# Patient Record
Sex: Female | Born: 1935 | Hispanic: No | Marital: Married | Smoking: Never smoker
Health system: Southern US, Community
[De-identification: ages and names within clinical notes are randomized; demographics above are authoritative.]

## PROBLEM LIST (undated history)

## (undated) DIAGNOSIS — J302 Other seasonal allergic rhinitis: Secondary | ICD-10-CM

## (undated) DIAGNOSIS — H35039 Hypertensive retinopathy, unspecified eye: Secondary | ICD-10-CM

## (undated) DIAGNOSIS — M170 Bilateral primary osteoarthritis of knee: Secondary | ICD-10-CM

## (undated) DIAGNOSIS — H409 Unspecified glaucoma: Secondary | ICD-10-CM

## (undated) DIAGNOSIS — I10 Essential (primary) hypertension: Secondary | ICD-10-CM

## (undated) DIAGNOSIS — H3530 Unspecified macular degeneration: Secondary | ICD-10-CM

## (undated) HISTORY — PX: CATARACT EXTRACTION: SUR2

## (undated) HISTORY — PX: WISDOM TOOTH EXTRACTION: SHX21

## (undated) HISTORY — DX: Hypertensive retinopathy, unspecified eye: H35.039

## (undated) HISTORY — PX: EYE SURGERY: SHX253

## (undated) HISTORY — PX: TONSILLECTOMY: SUR1361

## (undated) HISTORY — DX: Unspecified macular degeneration: H35.30

## (undated) HISTORY — DX: Unspecified glaucoma: H40.9

## (undated) HISTORY — PX: COLONOSCOPY: SHX174

---

## 1998-06-18 ENCOUNTER — Ambulatory Visit (HOSPITAL_COMMUNITY): Admission: RE | Admit: 1998-06-18 | Discharge: 1998-06-18 | Payer: Self-pay | Admitting: Internal Medicine

## 1998-06-18 ENCOUNTER — Encounter: Payer: Self-pay | Admitting: Internal Medicine

## 2003-09-10 ENCOUNTER — Encounter: Admission: RE | Admit: 2003-09-10 | Discharge: 2003-09-10 | Payer: Self-pay

## 2004-05-09 ENCOUNTER — Ambulatory Visit (HOSPITAL_COMMUNITY): Admission: RE | Admit: 2004-05-09 | Discharge: 2004-05-09 | Payer: Self-pay | Admitting: Gastroenterology

## 2005-04-27 ENCOUNTER — Encounter: Admission: RE | Admit: 2005-04-27 | Discharge: 2005-04-27 | Payer: Self-pay | Admitting: Gastroenterology

## 2007-03-06 ENCOUNTER — Encounter: Admission: RE | Admit: 2007-03-06 | Discharge: 2007-03-06 | Payer: Self-pay | Admitting: Internal Medicine

## 2008-03-06 ENCOUNTER — Encounter: Admission: RE | Admit: 2008-03-06 | Discharge: 2008-03-06 | Payer: Self-pay | Admitting: Internal Medicine

## 2008-03-18 ENCOUNTER — Encounter: Admission: RE | Admit: 2008-03-18 | Discharge: 2008-03-18 | Payer: Self-pay | Admitting: Internal Medicine

## 2009-09-15 ENCOUNTER — Encounter: Admission: RE | Admit: 2009-09-15 | Discharge: 2009-09-15 | Payer: Self-pay | Admitting: Internal Medicine

## 2010-04-10 ENCOUNTER — Encounter: Payer: Self-pay | Admitting: Internal Medicine

## 2010-08-05 NOTE — Op Note (Signed)
NAME:  Kaitlyn Berry, Kaitlyn Berry                  ACCOUNT NO.:  [id number]   MEDICAL RECORD NO.:  [id number]          PATIENT TYPE:  AMB   LOCATION:  ENDO                         FACILITY:  [facility]   PHYSICIAN:  John C. Madilyn Fireman, M.D.    DATE OF BIRTH:  June 09, 1935   DATE OF PROCEDURE:  05/09/2004  DATE OF DISCHARGE:                                 OPERATIVE REPORT   PROCEDURE:  Colonoscopy.   INDICATION FOR PROCEDURE:  Family history of colon cancer in a first-degree  relative.   PROCEDURE:  The patient was placed in the left lateral decubitus position  and placed on the pulse monitor with continuous low flow oxygen delivered  via nasal cannula.  He was sedated with 50 mcg IV fentanyl and 4 mg IV  Versed.  The Olympus video colonoscope was inserted into the rectum and  advanced its entire length but despite multiple position changes, torquing  maneuvers, and abdominal pressure, I was unable to reach the cecum despite  having the scope inserted its entire length.  It was not certain but felt  that the point of furthest penetration was to approximately the hepatic  flexure.  This area appeared normal, as did the transverse, descending,  sigmoid, and rectum.  The scope was then withdrawn and the patient returned  to the recovery room in stable condition.  He tolerated the procedure well  and there were no immediate complications.   IMPRESSION:  Normal incomplete colonoscopy to approximately the hepatic  flexure.   PLAN:  Will follow up with barium enema.      JCH/MEDQ  D:  05/09/2004  T:  05/09/2004  Job:  [id number]   cc:   Amalia Greenhouse. Frankey Poot, M.D.

## 2012-08-22 ENCOUNTER — Other Ambulatory Visit: Payer: Self-pay | Admitting: Internal Medicine

## 2012-08-22 DIAGNOSIS — M810 Age-related osteoporosis without current pathological fracture: Secondary | ICD-10-CM

## 2014-03-30 DIAGNOSIS — M179 Osteoarthritis of knee, unspecified: Secondary | ICD-10-CM | POA: Diagnosis not present

## 2014-03-30 DIAGNOSIS — F419 Anxiety disorder, unspecified: Secondary | ICD-10-CM | POA: Diagnosis not present

## 2014-03-30 DIAGNOSIS — R7301 Impaired fasting glucose: Secondary | ICD-10-CM | POA: Diagnosis not present

## 2014-03-30 DIAGNOSIS — I10 Essential (primary) hypertension: Secondary | ICD-10-CM | POA: Diagnosis not present

## 2014-04-29 DIAGNOSIS — H401231 Low-tension glaucoma, bilateral, mild stage: Secondary | ICD-10-CM | POA: Diagnosis not present

## 2014-05-20 DIAGNOSIS — H401231 Low-tension glaucoma, bilateral, mild stage: Secondary | ICD-10-CM | POA: Diagnosis not present

## 2014-06-08 DIAGNOSIS — I10 Essential (primary) hypertension: Secondary | ICD-10-CM | POA: Diagnosis not present

## 2014-06-08 DIAGNOSIS — J302 Other seasonal allergic rhinitis: Secondary | ICD-10-CM | POA: Diagnosis not present

## 2014-06-08 DIAGNOSIS — R7301 Impaired fasting glucose: Secondary | ICD-10-CM | POA: Diagnosis not present

## 2014-06-08 DIAGNOSIS — F419 Anxiety disorder, unspecified: Secondary | ICD-10-CM | POA: Diagnosis not present

## 2014-06-29 DIAGNOSIS — R252 Cramp and spasm: Secondary | ICD-10-CM | POA: Diagnosis not present

## 2014-06-29 DIAGNOSIS — H6122 Impacted cerumen, left ear: Secondary | ICD-10-CM | POA: Diagnosis not present

## 2014-06-29 DIAGNOSIS — I10 Essential (primary) hypertension: Secondary | ICD-10-CM | POA: Diagnosis not present

## 2014-06-29 DIAGNOSIS — R7301 Impaired fasting glucose: Secondary | ICD-10-CM | POA: Diagnosis not present

## 2014-07-15 DIAGNOSIS — H401211 Low-tension glaucoma, right eye, mild stage: Secondary | ICD-10-CM | POA: Diagnosis not present

## 2014-09-18 DIAGNOSIS — R7301 Impaired fasting glucose: Secondary | ICD-10-CM | POA: Diagnosis not present

## 2014-09-18 DIAGNOSIS — Z13220 Encounter for screening for lipoid disorders: Secondary | ICD-10-CM | POA: Diagnosis not present

## 2014-09-18 DIAGNOSIS — I10 Essential (primary) hypertension: Secondary | ICD-10-CM | POA: Diagnosis not present

## 2014-09-18 DIAGNOSIS — K029 Dental caries, unspecified: Secondary | ICD-10-CM | POA: Diagnosis not present

## 2014-11-16 DIAGNOSIS — R7301 Impaired fasting glucose: Secondary | ICD-10-CM | POA: Diagnosis not present

## 2014-11-16 DIAGNOSIS — I10 Essential (primary) hypertension: Secondary | ICD-10-CM | POA: Diagnosis not present

## 2014-11-16 DIAGNOSIS — M810 Age-related osteoporosis without current pathological fracture: Secondary | ICD-10-CM | POA: Diagnosis not present

## 2014-11-16 DIAGNOSIS — N3281 Overactive bladder: Secondary | ICD-10-CM | POA: Diagnosis not present

## 2014-11-16 DIAGNOSIS — F419 Anxiety disorder, unspecified: Secondary | ICD-10-CM | POA: Diagnosis not present

## 2014-11-16 DIAGNOSIS — Z1389 Encounter for screening for other disorder: Secondary | ICD-10-CM | POA: Diagnosis not present

## 2014-11-26 ENCOUNTER — Other Ambulatory Visit: Payer: Self-pay | Admitting: Internal Medicine

## 2014-11-26 DIAGNOSIS — E2839 Other primary ovarian failure: Secondary | ICD-10-CM

## 2015-01-18 DIAGNOSIS — I10 Essential (primary) hypertension: Secondary | ICD-10-CM | POA: Diagnosis not present

## 2015-01-18 DIAGNOSIS — F419 Anxiety disorder, unspecified: Secondary | ICD-10-CM | POA: Diagnosis not present

## 2015-01-18 DIAGNOSIS — Z23 Encounter for immunization: Secondary | ICD-10-CM | POA: Diagnosis not present

## 2015-01-18 DIAGNOSIS — R7301 Impaired fasting glucose: Secondary | ICD-10-CM | POA: Diagnosis not present

## 2015-01-18 DIAGNOSIS — M179 Osteoarthritis of knee, unspecified: Secondary | ICD-10-CM | POA: Diagnosis not present

## 2016-05-25 ENCOUNTER — Ambulatory Visit (INDEPENDENT_AMBULATORY_CARE_PROVIDER_SITE_OTHER): Payer: 59 | Admitting: Podiatry

## 2016-05-25 ENCOUNTER — Encounter: Payer: Self-pay | Admitting: Podiatry

## 2016-05-25 VITALS — BP 193/87 | HR 58

## 2016-05-25 DIAGNOSIS — M79676 Pain in unspecified toe(s): Secondary | ICD-10-CM | POA: Diagnosis not present

## 2016-05-25 DIAGNOSIS — B351 Tinea unguium: Secondary | ICD-10-CM | POA: Diagnosis not present

## 2016-05-25 NOTE — Progress Notes (Signed)
   Subjective:    Patient ID: Kaitlyn Berry, female    DOB: 05/10/1935, 81 y.o.   MRN: [id number]  HPI this patient presents the office with chief complaint of painful long thick nails, especially the big toes on both feet. He says that the toenails become painful and sore walking and wearing her shoes. She states that she is now unable to self treat and see office today for definitive treatment of this painful nail condition    Review of Systems  All other systems reviewed and are negative.      Objective:   Physical Exam GENERAL APPEARANCE: Alert, conversant. Appropriately groomed. No acute distress.  VASCULAR: Pedal pulses are  palpable at  [facility] and PT bilateral.  Capillary refill time is immediate to all digits,  Normal temperature gradient.  Digital hair growth is present bilateral  NEUROLOGIC: sensation is normal to 5.07 monofilament at 5/5 sites bilateral.  Light touch is intact bilateral, Muscle strength normal.  MUSCULOSKELETAL: acceptable muscle strength, tone and stability bilateral.  Intrinsic muscluature intact bilateral.  Rectus appearance of foot and digits noted bilateral.   DERMATOLOGIC: skin color, texture, and turgor are within normal limits.  No preulcerative lesions or ulcers  are seen, no interdigital maceration noted.  No open lesions present.   No drainage noted.  NAILS  Thick disfigured discolored nails both feet.        Assessment & Plan:  Onychomycosis  B/L  IE  Debride nails.  RTC 3 months.   Helane GuntherGregory Mayer DPM

## 2016-08-17 ENCOUNTER — Ambulatory Visit: Payer: 59 | Admitting: Podiatry

## 2017-09-26 ENCOUNTER — Ambulatory Visit (INDEPENDENT_AMBULATORY_CARE_PROVIDER_SITE_OTHER): Payer: Medicare HMO | Admitting: Orthopaedic Surgery

## 2017-09-26 ENCOUNTER — Ambulatory Visit (INDEPENDENT_AMBULATORY_CARE_PROVIDER_SITE_OTHER): Payer: Medicare HMO

## 2017-09-26 ENCOUNTER — Encounter (INDEPENDENT_AMBULATORY_CARE_PROVIDER_SITE_OTHER): Payer: Self-pay | Admitting: Orthopaedic Surgery

## 2017-09-26 ENCOUNTER — Ambulatory Visit (INDEPENDENT_AMBULATORY_CARE_PROVIDER_SITE_OTHER): Payer: Self-pay

## 2017-09-26 DIAGNOSIS — M25562 Pain in left knee: Secondary | ICD-10-CM

## 2017-09-26 DIAGNOSIS — M25561 Pain in right knee: Secondary | ICD-10-CM

## 2017-09-26 DIAGNOSIS — M1711 Unilateral primary osteoarthritis, right knee: Secondary | ICD-10-CM

## 2017-09-26 DIAGNOSIS — G8929 Other chronic pain: Secondary | ICD-10-CM

## 2017-09-26 DIAGNOSIS — M545 Low back pain: Secondary | ICD-10-CM

## 2017-09-26 DIAGNOSIS — M1712 Unilateral primary osteoarthritis, left knee: Secondary | ICD-10-CM | POA: Diagnosis not present

## 2017-09-26 MED ORDER — LIDOCAINE HCL 1 % IJ SOLN
3.0000 mL | INTRAMUSCULAR | Status: AC | PRN
  Administered 2017-09-26: 3 mL

## 2017-09-26 MED ORDER — METHYLPREDNISOLONE ACETATE 40 MG/ML IJ SUSP
40.0000 mg | INTRAMUSCULAR | Status: AC | PRN
  Administered 2017-09-26: 40 mg via INTRA_ARTICULAR

## 2017-09-26 NOTE — Progress Notes (Signed)
Office Visit Note   Patient: Kaitlyn Berry           Date of Birth: 03-Aug-1935           MRN: [id number] Visit Date: 09/26/2017              Requested by: Fleet Contras, MD [address] [place], [place] [address] PCP: Fleet Contras, MD   Assessment & Plan: Visit Diagnoses:  1. Chronic pain of both knees   2. Chronic bilateral low back pain, with sciatica presence unspecified   3. Unilateral primary osteoarthritis, right knee   4. Unilateral primary osteoarthritis, left knee     Plan: Given the extent of her low back pain and right-sided sciatica I would like to obtain an MRI to see if she would benefit from an intervention such as epidural steroid injection to the right side.  As far as her knees go, we tried steroid injection to both knees today and I do feel she is a candidate for hyaluronic acid which we will order for both knees.  All questions concerns were answered and addressed.  She understands why were trying this treatment regimen and understands the risk minutes of steroid injections as well.  All questions concerns were answered and addressed.  We will see her back in 4 weeks to place hyaluronic acid injections in both her knees and go over the MRI of her lumbar spine.  Follow-Up Instructions: Return in about 1 month (around 10/24/2017).   Orders:  Orders Placed This Encounter  Procedures  . Large Joint Inj  . Large Joint Inj  . XR Knee 1-2 Views Left  . XR Knee 1-2 Views Right  . XR Lumbar Spine 2-3 Views   No orders of the defined types were placed in this encounter.     Procedures: Large Joint Inj: R knee on 09/26/2017 3:14 PM Indications: diagnostic evaluation and pain Details: 22 G 1.5 in needle, superolateral approach  Arthrogram: No  Medications: 3 mL lidocaine 1 %; 40 mg methylPREDNISolone acetate 40 MG/ML Outcome: tolerated well, no immediate complications Procedure, treatment alternatives, risks and benefits explained, specific risks discussed.  Consent was given by the patient. Immediately prior to procedure a time out was called to verify the correct patient, procedure, equipment, support staff and site/side marked as required. Patient was prepped and draped in the usual sterile fashion.   Large Joint Inj: L knee on 09/26/2017 3:14 PM Indications: diagnostic evaluation and pain Details: 22 G 1.5 in needle, superolateral approach  Arthrogram: No  Medications: 3 mL lidocaine 1 %; 40 mg methylPREDNISolone acetate 40 MG/ML Outcome: tolerated well, no immediate complications Procedure, treatment alternatives, risks and benefits explained, specific risks discussed. Consent was given by the patient. Immediately prior to procedure a time out was called to verify the correct patient, procedure, equipment, support staff and site/side marked as required. Patient was prepped and draped in the usual sterile fashion.       Clinical Data: No additional findings.   Subjective: Chief Complaint  Patient presents with  . Left Knee - Pain  . Right Knee - Pain  The patient is someone I am seeing for the first time with bilateral knee pain and low back pain.  I seen family members before.  Her pain is been daily and is been hurting for several months now with a low back.  It is waking her up at night as well.  Both knees hurt with the right worse than left.  She hurts with activities when she is up and when she is laying down.  Is been slowly getting worse with time.  She does describe sciatic symptoms going down the right leg.  She denies any change in bowel bladder function.  She denies any acute injuries.  She is not a diabetic.  HPI  Review of Systems She currently denies any headache, chest pain, shortness of breath, fever, chills, nausea, vomiting.  She is alert and oriented x3 in no acute distress  Objective: Vital Signs: There were no vitals taken for this visit.  Physical Exam She is alert and oriented x3 in no acute distress Ortho  Exam Examination of both knees shows varus malalignment of the right knee but neutral alignment of the left knee.  Knees have medial joint line tenderness and patellofemoral crepitation.  Both knees have full range of motion and are ligamentously stable.  She does have limitations with flexion extension of her lumbar spine with pain in the lower aspects of the lumbar spine at the midline and medial and lateral with flexion extension.  She has tight hamstrings.  She does have positive straight leg raise on the right side.  There is no weakness or numbness tingling her feet Specialty Comments:  No specialty comments available.  Imaging: Xr Knee 1-2 Views Left  Result Date: 09/26/2017 2 views of left knee show moderate arthritic findings but no acute findings.  Xr Knee 1-2 Views Right  Result Date: 09/26/2017 2 views of the right knee show severe tricompartmental fitting changes.  There is complete loss the medial joint space with sclerotic changes on that side.  There is varus malalignment.  There is significant patellofemoral disease as well.  Xr Lumbar Spine 2-3 Views  Result Date: 09/26/2017 2 views of the lumbar spine shows severe arthritic changes at L4-L5 and L5-S1 with significant disc space narrowing and loss of lordosis.  There is arthritic changes in the posterior elements as well.    PMFS History: There are no active problems to display for this patient.  History reviewed. No pertinent past medical history.  History reviewed. No pertinent family history.  History reviewed. No pertinent surgical history. Social History   Occupational History  . Not on file  Tobacco Use  . Smoking status: Never Smoker  . Smokeless tobacco: Never Used  Substance and Sexual Activity  . Alcohol use: No  . Drug use: No  . Sexual activity: Not on file

## 2017-09-28 ENCOUNTER — Telehealth (INDEPENDENT_AMBULATORY_CARE_PROVIDER_SITE_OTHER): Payer: Self-pay

## 2017-09-28 ENCOUNTER — Other Ambulatory Visit (INDEPENDENT_AMBULATORY_CARE_PROVIDER_SITE_OTHER): Payer: Self-pay

## 2017-09-28 DIAGNOSIS — M4807 Spinal stenosis, lumbosacral region: Secondary | ICD-10-CM

## 2017-09-28 NOTE — Telephone Encounter (Signed)
Noted  

## 2017-09-28 NOTE — Telephone Encounter (Signed)
Bilateral gel injections  

## 2017-10-01 ENCOUNTER — Telehealth (INDEPENDENT_AMBULATORY_CARE_PROVIDER_SITE_OTHER): Payer: Self-pay

## 2017-10-01 NOTE — Telephone Encounter (Signed)
Submitted application online for Monovisc, bilateral knee 

## 2017-10-08 ENCOUNTER — Ambulatory Visit
Admission: RE | Admit: 2017-10-08 | Discharge: 2017-10-08 | Disposition: A | Discharge status: Home / Self Care | Payer: Medicare HMO | Source: Ambulatory Visit | Attending: Orthopaedic Surgery | Admitting: Orthopaedic Surgery

## 2017-10-08 DIAGNOSIS — M4807 Spinal stenosis, lumbosacral region: Secondary | ICD-10-CM

## 2017-10-10 ENCOUNTER — Telehealth (INDEPENDENT_AMBULATORY_CARE_PROVIDER_SITE_OTHER): Payer: Self-pay

## 2017-10-10 NOTE — Telephone Encounter (Signed)
Initiated PA for [employer], bilateral knee with Philippa SicksKristina G.  Stated that PA form will be faxed today.

## 2017-10-16 ENCOUNTER — Telehealth (INDEPENDENT_AMBULATORY_CARE_PROVIDER_SITE_OTHER): Payer: Self-pay

## 2017-10-16 NOTE — Telephone Encounter (Signed)
PA required for Monovisc, bilateral knee. Faxed completed PA form to [employer] at [phone]. 

## 2017-10-19 ENCOUNTER — Telehealth (INDEPENDENT_AMBULATORY_CARE_PROVIDER_SITE_OTHER): Payer: Self-pay

## 2017-10-19 NOTE — Telephone Encounter (Signed)
PA approved for Monovisc, bilateral knee through [employer] Approval# [id number] Valid 10/24/2017 - 01/24/2018

## 2017-10-19 NOTE — Telephone Encounter (Signed)
Faxed request for additional information form to [employer] at [phone] for Monovisc injection, bilateral knee.

## 2017-10-22 ENCOUNTER — Telehealth (INDEPENDENT_AMBULATORY_CARE_PROVIDER_SITE_OTHER): Payer: Self-pay

## 2017-10-22 NOTE — Telephone Encounter (Signed)
Patient is approved for Monovisc, bilateral knee. [employer]-pay of $25.00 Patient will be responsible for 20% OOP. PA Approval# [id number] Valid 10/24/2017- 01/24/2018 Appt. Scheduled 10/24/2017

## 2017-10-24 ENCOUNTER — Ambulatory Visit (INDEPENDENT_AMBULATORY_CARE_PROVIDER_SITE_OTHER): Payer: Medicare HMO | Admitting: Physician Assistant

## 2017-10-24 ENCOUNTER — Encounter (INDEPENDENT_AMBULATORY_CARE_PROVIDER_SITE_OTHER): Payer: Self-pay | Admitting: Physician Assistant

## 2017-10-24 DIAGNOSIS — M1712 Unilateral primary osteoarthritis, left knee: Secondary | ICD-10-CM

## 2017-10-24 DIAGNOSIS — M1711 Unilateral primary osteoarthritis, right knee: Secondary | ICD-10-CM

## 2017-10-24 DIAGNOSIS — M4807 Spinal stenosis, lumbosacral region: Secondary | ICD-10-CM | POA: Diagnosis not present

## 2017-10-24 MED ORDER — HYALURONAN 88 MG/4ML IX SOSY
88.0000 mg | PREFILLED_SYRINGE | INTRA_ARTICULAR | Status: AC | PRN
  Administered 2017-10-24: 88 mg via INTRA_ARTICULAR

## 2017-10-24 NOTE — Progress Notes (Signed)
Office Visit Note   Patient: Kaitlyn Berry           Date of Birth: February 26, 1936           MRN: [id number] Visit Date: 10/24/2017              Requested by: Fleet Contras, MD [address] [place], [place] [address] PCP: Fleet Contras, MD   Assessment & Plan: Visit Diagnoses:  1. Spinal stenosis of lumbosacral region   2. Unilateral primary osteoarthritis, right knee   3. Unilateral primary osteoarthritis, left knee     Plan: We will send her to neurosurgery for second opinion in regards to her spinal stenosis and radicular symptoms right leg.  Did offer a epidural steroid injection with Dr. Alvester Morin she would just like to see if there is anything else can be done but she is really not interested in any surgery. In regards to her knees she understands that she can have cortisone injections every 3 months and supplemental injections in both knees every 6 months.  She will work on Dance movement psychotherapist of the knees.  Follow-up with [place] PRN  Follow-Up Instructions: Return if symptoms worsen or fail to improve.   Orders:  Orders Placed This Encounter  Procedures  . Large Joint Inj: bilateral knee   No orders of the defined types were placed in this encounter.     Procedures: Large Joint Inj: bilateral knee on 10/24/2017 3:11 PM Indications: pain Details: 22 G 1.5 in needle, anterolateral approach  Arthrogram: No  Medications (Right): 88 mg Hyaluronan 88 MG/4ML Medications (Left): 88 mg Hyaluronan 88 MG/4ML Outcome: tolerated well, no immediate complications Procedure, treatment alternatives, risks and benefits explained, specific risks discussed. Consent was given by the patient. Immediately prior to procedure a time out was called to verify the correct patient, procedure, equipment, support staff and site/side marked as required. Patient was prepped and draped in the usual sterile fashion.       Clinical Data: No additional findings.   Subjective: Chief Complaint    Patient presents with  . Left Knee - Follow-up  . Right Knee - Follow-up    HPI Kaitlyn Berry comes in today for follow-up of her low back pain with radicular symptomsn right lower extremity.  She has pain that radiates down from her low back to around the lateral aspect of her right knee.  Denies any numbness tingling down the leg.  Back pain does awaken her at night. She is also here to review the MRI of her lumbar spine.  Also for Monovisc injections in both knees.  She has moderate arthritic changes of the left knee and severe tricompartmental changes of the right knee.  She is given cortisone injections bilateral that she states did not last long. MRI of the lumbar spine dated 10/08/2017 showed severe spinal canal stenosis at L4 4 5 and moderate to moderately severe central canal stenosis at L5-S1.  L4-5 with moderate to moderately severe foraminal narrowing worse on the left.  L5-S1 moderate to moderately severe foraminal narrowing worse on the right.  MRI images were reviewed with the patient lumbar spine model was used for visualization also. Review of Systems No bowel bladder dysfunction please see HPI otherwise  Objective: Vital Signs: There were no vitals taken for this visit.  Physical Exam  Constitutional: She is oriented to person, place, and time. She appears well-developed and well-nourished. No distress.  Pulmonary/Chest: Effort normal.  Neurological: She is alert and oriented to person,  place, and time.  Skin: She is not diaphoretic.    Ortho Exam Bilateral knees good range of motion.  No abnormal warmth erythema or effusion.  She has tight hamstrings bilaterally.  Positive straight leg on the right.  Negative on the left.  No tenderness over the lower paraspinous region or spinal column. Specialty Comments:  No specialty comments available.  Imaging: No results found.   PMFS History: There are no active problems to display for this patient.  No past medical history  on file.  No family history on file.  No past surgical history on file. Social History   Occupational History  . Not on file  Tobacco Use  . Smoking status: Never Smoker  . Smokeless tobacco: Never Used  Substance and Sexual Activity  . Alcohol use: No  . Drug use: No  . Sexual activity: Not on file        +--+-  q 

## 2017-10-25 ENCOUNTER — Telehealth (INDEPENDENT_AMBULATORY_CARE_PROVIDER_SITE_OTHER): Payer: Self-pay | Admitting: Physician Assistant

## 2017-10-25 ENCOUNTER — Other Ambulatory Visit (INDEPENDENT_AMBULATORY_CARE_PROVIDER_SITE_OTHER): Payer: Self-pay | Admitting: Radiology

## 2017-10-25 DIAGNOSIS — M4807 Spinal stenosis, lumbosacral region: Secondary | ICD-10-CM

## 2017-10-25 NOTE — Telephone Encounter (Signed)
Yes. [facility] and let them know first available appointment. Phone: [phone] if patient needs to contact them, in the next couple of days. Fax: [phone]

## 2017-10-25 NOTE — Telephone Encounter (Signed)
Patient called stating that she would like the referral to the Neurologist to be an Urgent referral.  CB#[phone].  Thank you.

## 2017-10-25 NOTE — Telephone Encounter (Signed)
Can we send this referral as a high priority?

## 2017-10-31 ENCOUNTER — Ambulatory Visit (INDEPENDENT_AMBULATORY_CARE_PROVIDER_SITE_OTHER): Payer: Medicare HMO | Admitting: Orthopaedic Surgery

## 2017-11-01 ENCOUNTER — Telehealth (INDEPENDENT_AMBULATORY_CARE_PROVIDER_SITE_OTHER): Payer: Self-pay | Admitting: Physician Assistant

## 2017-11-01 NOTE — Telephone Encounter (Signed)
Patient called inquiring  about her being referred to Dr. Wynetta Emeryram. (Neuro surgeon) Patient asked if Bronson CurbGil  had worked with Dr Wynetta Emeryram in the past is the reason she was referred the doctor. The number to contact patient is [phone]

## 2017-11-01 NOTE — Telephone Encounter (Signed)
Patient aware we send a lot of our patients over to [facility]

## 2017-12-19 ENCOUNTER — Telehealth (INDEPENDENT_AMBULATORY_CARE_PROVIDER_SITE_OTHER): Payer: Self-pay | Admitting: Physician Assistant

## 2017-12-19 NOTE — Telephone Encounter (Signed)
Patient is requesting to speak with Kaitlyn Berry, she had a question about the pain in her knee. She hasn't had any relief from the gel injections. I guess she is wanting to know what she can do since she is still in "awful pain" # [phone]

## 2017-12-19 NOTE — Telephone Encounter (Signed)
See note from patient

## 2017-12-20 NOTE — Telephone Encounter (Signed)
Called patient

## 2018-04-12 ENCOUNTER — Ambulatory Visit: Payer: Medicare HMO | Admitting: Podiatry

## 2018-04-12 ENCOUNTER — Encounter: Payer: Self-pay | Admitting: Podiatry

## 2018-04-12 DIAGNOSIS — B351 Tinea unguium: Secondary | ICD-10-CM | POA: Diagnosis not present

## 2018-04-12 DIAGNOSIS — M79676 Pain in unspecified toe(s): Secondary | ICD-10-CM

## 2018-04-12 NOTE — Progress Notes (Signed)
Complaint:  Visit Type: Patient returns to my office for continued preventative foot care services. Complaint: Patient states" my nails have grown long and thick and become painful to walk and wear shoes" Patient has not been seen for over 1 and a 1/2 years. The patient presents for preventative foot care services. No changes to ROS  Podiatric Exam: Vascular: dorsalis pedis and posterior tibial pulses are palpable bilateral. Capillary return is immediate. Temperature gradient is WNL. Skin turgor WNL  Sensorium: Normal Semmes Weinstein monofilament test. Normal tactile sensation bilaterally. Nail Exam: Pt has thick disfigured discolored nails with subungual debris noted bilateral entire nail hallux through fifth toenails Ulcer Exam: There is no evidence of ulcer or pre-ulcerative changes or infection. Orthopedic Exam: Muscle tone and strength are WNL. No limitations in general ROM. No crepitus or effusions noted. Foot type and digits show no abnormalities. HAV  B/L.  Hammer toes  B/L. Skin: No Porokeratosis. No infection or ulcers  Diagnosis:  Onychomycosis, , Pain in right toe, pain in left toes  Treatment & Plan Procedures and Treatment: Consent by patient was obtained for treatment procedures.   Debridement of mycotic and hypertrophic toenails, 1 through 5 bilateral and clearing of subungual debris. No ulceration, no infection noted.  Return Visit-Office Procedure: Patient instructed to return to the office for a follow up visit 3 months for continued evaluation and treatment.    Helane Gunther DPM

## 2019-01-10 IMAGING — MR MR LUMBAR SPINE W/O CM
5 series · 41 of 48 positions shown · non-contrast
Comparison: Two views lumbar spine performed at [REDACTED] 09/26/2017.

CLINICAL DATA: Right worse than left low back pain which
intermittently radiates into the left leg for 8 months. No known
injury.

EXAM:
MRI LUMBAR SPINE WITHOUT CONTRAST
TECHNIQUE: Multiplanar, multisequence MR imaging of the lumbar spine was
performed. No intravenous contrast was administered.

[Series 3: T2 post-contrast · sagittal · 4.0mm · 0.88mm/px · 6 of 14 slices shown]
[im 1/14]
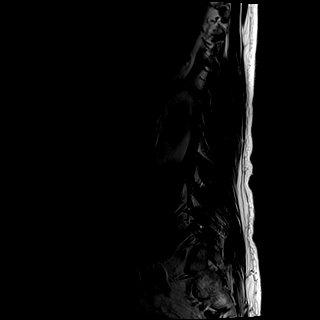
[im 3/14]
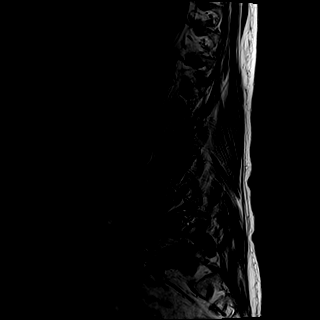
[im 6/14]
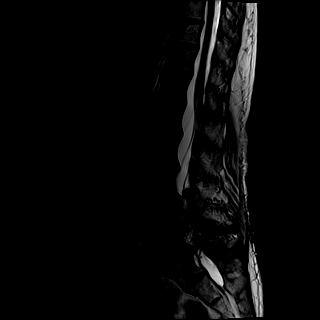
[im 8/14]
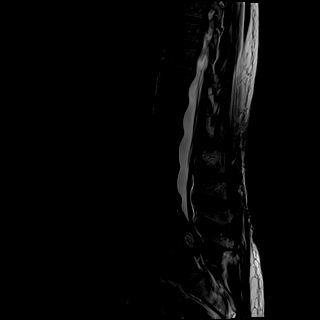
[im 11/14]
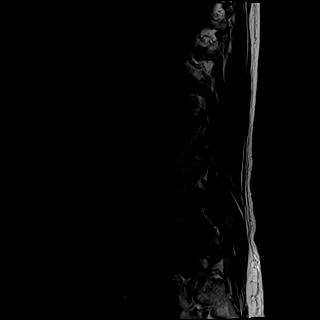
[im 14/14]
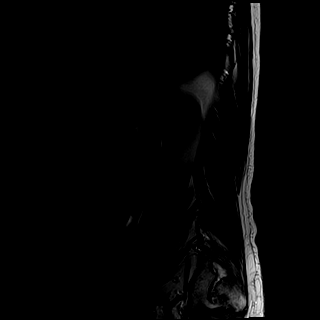

[Series 4: T1 · sagittal · 4.0mm · 0.88mm/px · 6 of 14 slices shown (1 of 2)]
[im 1/14]
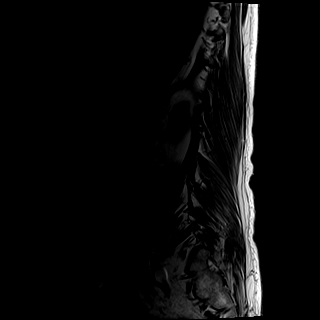
[im 3/14]
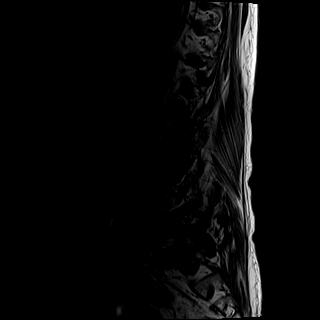
[im 6/14]
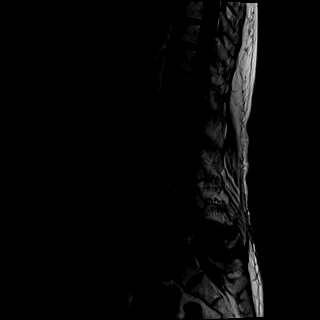
[im 8/14]
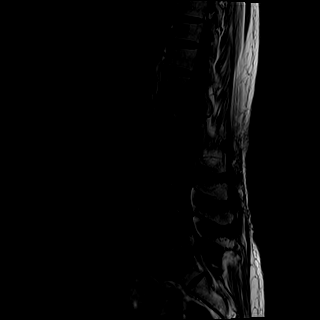
[im 11/14]
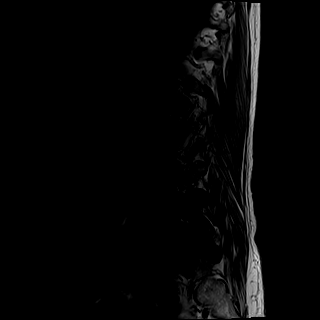
[im 14/14]
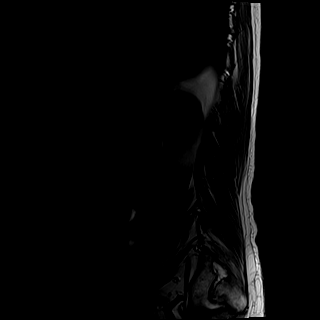

[Series 5: tirm sag · sagittal · 4.0mm · 0.55mm/px · 6 of 14 slices shown]
[im 1/14]
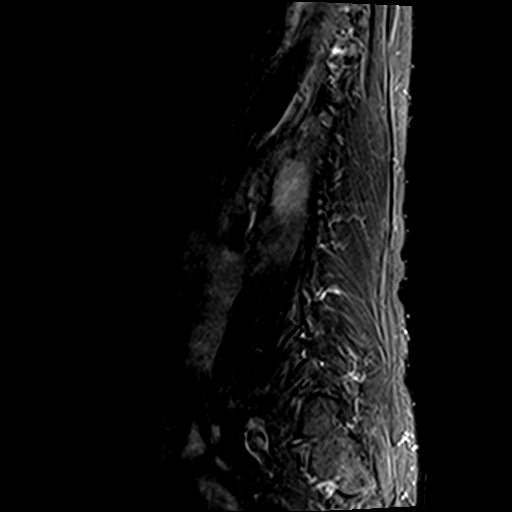
[im 3/14]
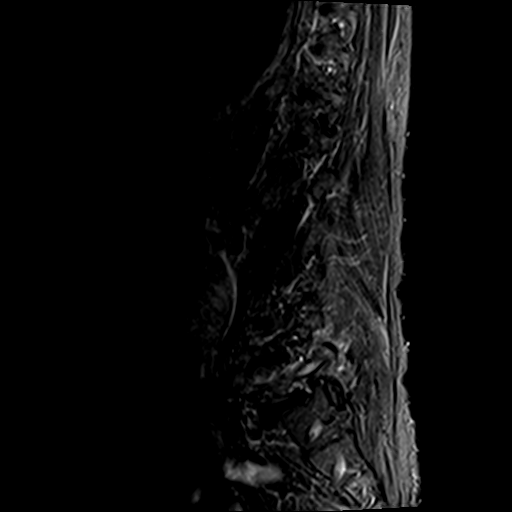
[im 6/14]
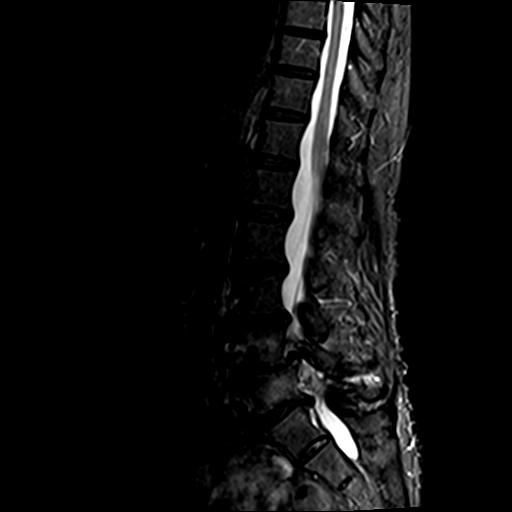
[im 8/14]
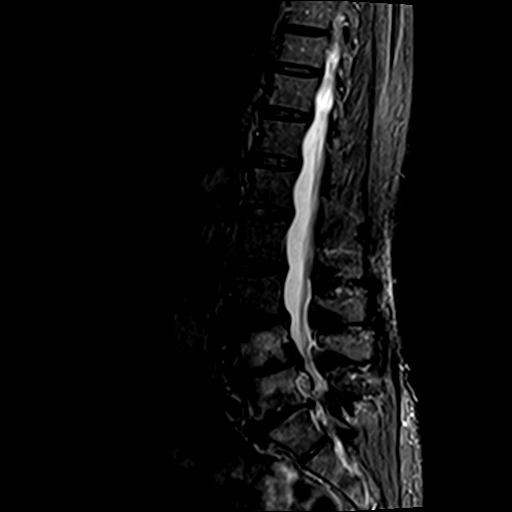
[im 11/14]
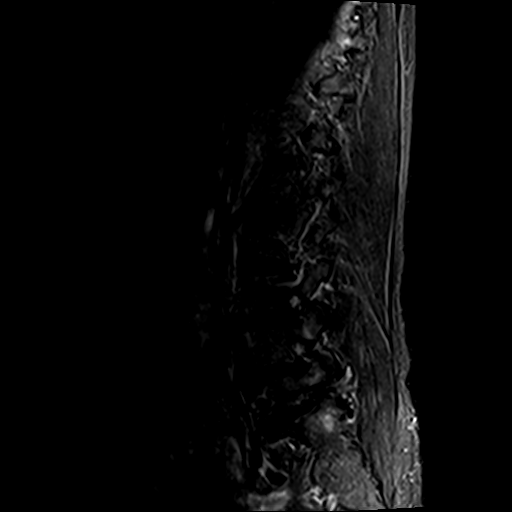
[im 14/14]
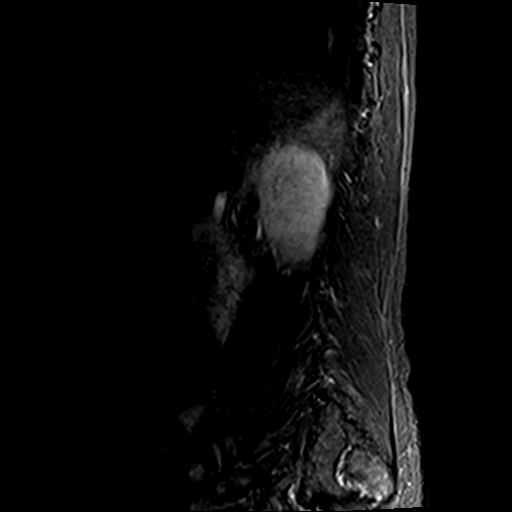

[Series 6: T2 · axial · 4.0mm · 0.70mm/px · z∈[+27,+212]mm · 14 of 34 slices shown]
[im 1/34]
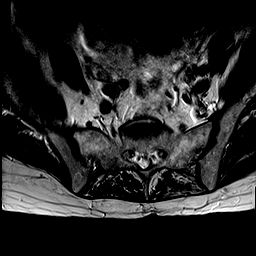
[im 3/34]
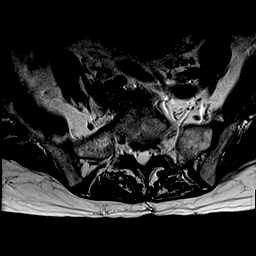
[im 5/34]
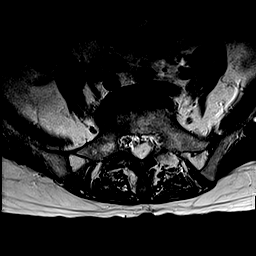
[im 8/34]
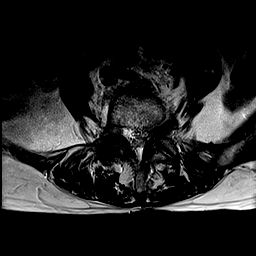
[im 10/34]
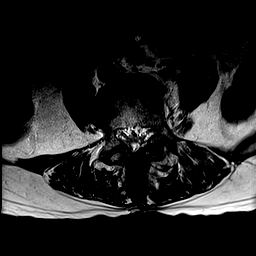
[im 12/34]
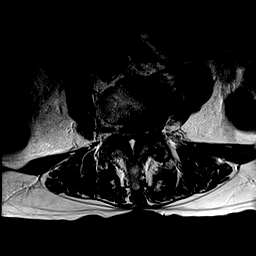
[im 15/34]
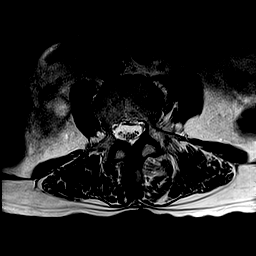
[im 17/34]
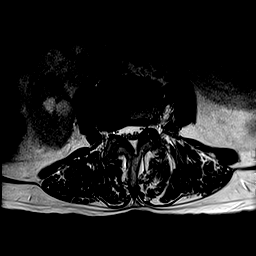
[im 19/34]
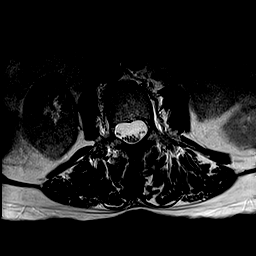
[im 22/34]
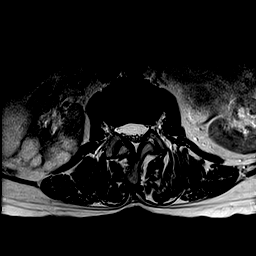
[im 24/34]
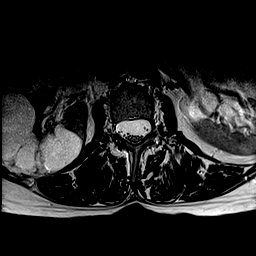
[im 26/34]
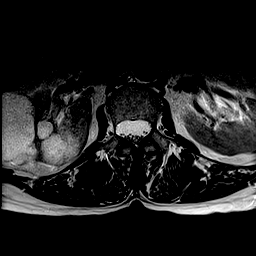
[im 29/34]
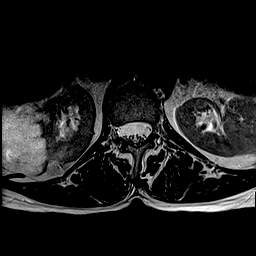
[im 34/34]
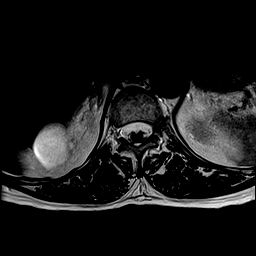

[Series 7: T1 · axial · 4.0mm · 0.70mm/px · z∈[+27,+212]mm · 9 of 34 slices shown (2 of 2)]
[im 1/34]
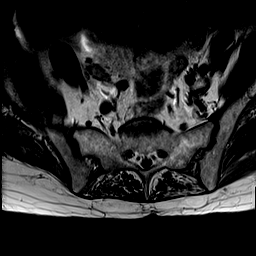
[im 5/34]
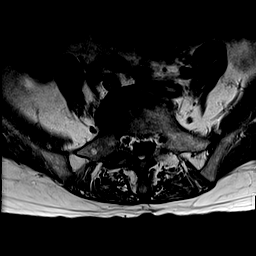
[im 10/34]
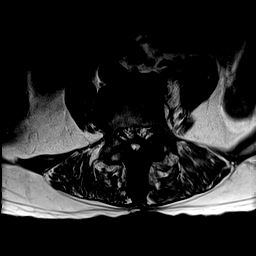
[im 15/34]
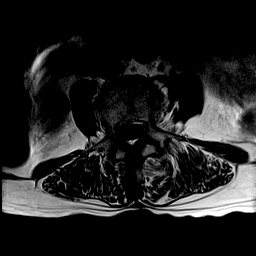
[im 17/34]
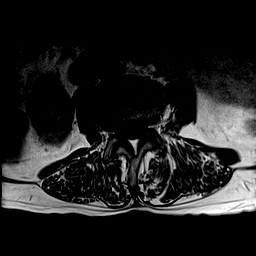
[im 19/34]
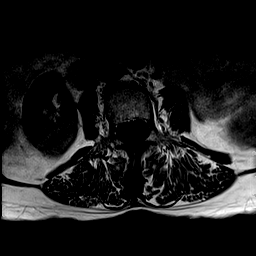
[im 24/34]
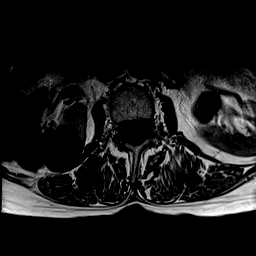
[im 29/34]
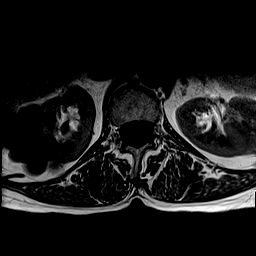
[im 34/34]
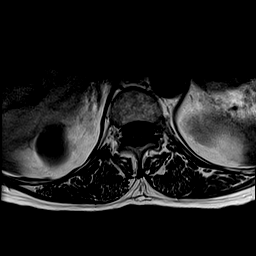

[41 of 48 positions shown; findings below may reference images not displayed]

FINDINGS: Segmentation:  Standard.  Rudimentary disc material S1-2 noted.

Alignment: Convex right scoliosis with the apex at approximately L4
is identified. 0.4 cm facet mediated anterolisthesis L4 on L5 is
also seen.

Vertebrae: No fracture or focal lesion. Marked degenerative endplate
signal change is seen at L4-5 and L5-S1.

Conus medullaris and cauda equina: Conus extends to the L1 level.
Conus and cauda equina appear normal.

Paraspinal and other soft tissues: Multiple T2 hyperintense lesions
in the right kidney are likely cysts.

Disc levels:

T9-10, T10-11 and T11-12 are imaged in the sagittal plane only. Mild
disc bulging is seen at these levels without stenosis.

T12-L1: Negative.

L1-2: Mild facet degenerative change and a minimal disc bulge
without stenosis.

L2-3: Minimal disc bulge and mild facet degenerative change without
stenosis.

L3-4: There is a shallow disc bulge, ligamentum flavum thickening
and mild facet degenerative change. Mild central canal narrowing is
seen. The foramina are open.

L4-5: The disc is uncovered and bulging and there is a superimposed
left paracentral disc protrusion with cephalad extension. Advanced
bilateral facet degenerative disease and ligamentum flavum
thickening are seen. Severe central canal stenosis is present and
there is marked narrowing in the subarticular recesses which is
somewhat worse on the left. Moderate to moderately severe foraminal
narrowing is worse on the left.

L5-S1: Right worse than left facet arthropathy and ligamentum flavum
thickening are identified. Diffuse broad-based disc bulge causes
moderate to moderately severe central canal stenosis and narrowing
in the right subarticular recess. Moderate to moderately severe
foraminal narrowing is worse on the right.
IMPRESSION: Spondylosis appearing worst at L4-5 where there is severe central
canal narrowing and marked narrowing in both subarticular recesses,
worse on the left. Moderate to moderately severe foraminal narrowing
is also worse on the left at this level.

Moderate to moderately severe central canal and right foraminal
stenosis at L5-S1 where there is also some narrowing in the right
subarticular recess.

Mild central canal narrowing L3-4.

## 2019-02-05 ENCOUNTER — Ambulatory Visit (INDEPENDENT_AMBULATORY_CARE_PROVIDER_SITE_OTHER): Payer: Medicare Other | Admitting: Podiatry

## 2019-02-05 ENCOUNTER — Other Ambulatory Visit: Payer: Self-pay

## 2019-02-05 ENCOUNTER — Encounter: Payer: Self-pay | Admitting: Podiatry

## 2019-02-05 DIAGNOSIS — M79676 Pain in unspecified toe(s): Secondary | ICD-10-CM | POA: Diagnosis not present

## 2019-02-05 DIAGNOSIS — B351 Tinea unguium: Secondary | ICD-10-CM | POA: Diagnosis not present

## 2019-02-05 NOTE — Progress Notes (Signed)
Complaint:  Visit Type: Patient returns to my office for continued preventative foot care services. Complaint: Patient states" my nails have grown long and thick and become painful to walk and wear shoes" Patient has not been seen for over 1 and a 1/2 years. The patient presents for preventative foot care services. No changes to ROS  Podiatric Exam: Vascular: dorsalis pedis and posterior tibial pulses are palpable bilateral. Capillary return is immediate. Temperature gradient is WNL. Skin turgor WNL  Sensorium: Normal Semmes Weinstein monofilament test. Normal tactile sensation bilaterally. Nail Exam: Pt has thick disfigured discolored nails with subungual debris noted bilateral entire nail hallux through fifth toenails Ulcer Exam: There is no evidence of ulcer or pre-ulcerative changes or infection. Orthopedic Exam: Muscle tone and strength are WNL. No limitations in general ROM. No crepitus or effusions noted. Foot type and digits show no abnormalities. HAV  B/L.  Hammer toes  B/L. Skin: No Porokeratosis. No infection or ulcers  Diagnosis:  Onychomycosis, , Pain in right toe, pain in left toes  Treatment & Plan Procedures and Treatment: Consent by patient was obtained for treatment procedures.   Debridement of mycotic and hypertrophic toenails, 1 through 5 bilateral and clearing of subungual debris. No ulceration, no infection noted.  Return Visit-Office Procedure: Patient instructed to return to the office for a follow up visit prn  for continued evaluation and treatment.    Gardiner Barefoot DPM

## 2019-02-18 ENCOUNTER — Telehealth (HOSPITAL_COMMUNITY): Payer: Self-pay | Admitting: *Deleted

## 2019-02-18 NOTE — Telephone Encounter (Signed)
02/18/19 10:30 am tried home and cell to reach pt to schedule appointment requested by Dr. Jeanie Cooks.

## 2019-02-21 ENCOUNTER — Other Ambulatory Visit (HOSPITAL_COMMUNITY): Payer: Self-pay | Admitting: Internal Medicine

## 2019-02-21 DIAGNOSIS — I739 Peripheral vascular disease, unspecified: Secondary | ICD-10-CM

## 2019-02-24 ENCOUNTER — Other Ambulatory Visit: Payer: Self-pay

## 2019-02-24 ENCOUNTER — Ambulatory Visit (HOSPITAL_COMMUNITY)
Admission: RE | Admit: 2019-02-24 | Discharge: 2019-02-24 | Disposition: A | Discharge status: Home / Self Care | Payer: Medicare Other | Source: Ambulatory Visit | Attending: Family | Admitting: Family

## 2019-02-24 DIAGNOSIS — I739 Peripheral vascular disease, unspecified: Secondary | ICD-10-CM | POA: Insufficient documentation

## 2019-04-01 DIAGNOSIS — I10 Essential (primary) hypertension: Secondary | ICD-10-CM | POA: Diagnosis not present

## 2019-04-01 DIAGNOSIS — L259 Unspecified contact dermatitis, unspecified cause: Secondary | ICD-10-CM | POA: Diagnosis not present

## 2019-04-01 DIAGNOSIS — J302 Other seasonal allergic rhinitis: Secondary | ICD-10-CM | POA: Diagnosis not present

## 2019-04-01 DIAGNOSIS — R7303 Prediabetes: Secondary | ICD-10-CM | POA: Diagnosis not present

## 2019-04-01 DIAGNOSIS — M179 Osteoarthritis of knee, unspecified: Secondary | ICD-10-CM | POA: Diagnosis not present

## 2019-04-20 DIAGNOSIS — I10 Essential (primary) hypertension: Secondary | ICD-10-CM | POA: Diagnosis not present

## 2019-05-12 DIAGNOSIS — H903 Sensorineural hearing loss, bilateral: Secondary | ICD-10-CM | POA: Diagnosis not present

## 2019-05-18 DIAGNOSIS — I10 Essential (primary) hypertension: Secondary | ICD-10-CM | POA: Diagnosis not present

## 2019-06-03 DIAGNOSIS — H903 Sensorineural hearing loss, bilateral: Secondary | ICD-10-CM | POA: Diagnosis not present

## 2019-06-18 DIAGNOSIS — H401221 Low-tension glaucoma, left eye, mild stage: Secondary | ICD-10-CM | POA: Diagnosis not present

## 2019-06-18 DIAGNOSIS — H401211 Low-tension glaucoma, right eye, mild stage: Secondary | ICD-10-CM | POA: Diagnosis not present

## 2019-06-18 DIAGNOSIS — I10 Essential (primary) hypertension: Secondary | ICD-10-CM | POA: Diagnosis not present

## 2019-06-18 DIAGNOSIS — H353132 Nonexudative age-related macular degeneration, bilateral, intermediate dry stage: Secondary | ICD-10-CM | POA: Diagnosis not present

## 2019-06-18 DIAGNOSIS — Z961 Presence of intraocular lens: Secondary | ICD-10-CM | POA: Diagnosis not present

## 2019-08-22 ENCOUNTER — Other Ambulatory Visit: Payer: Self-pay

## 2019-08-22 ENCOUNTER — Ambulatory Visit: Payer: Medicare HMO | Admitting: Podiatry

## 2019-08-22 DIAGNOSIS — M79676 Pain in unspecified toe(s): Secondary | ICD-10-CM | POA: Diagnosis not present

## 2019-08-22 DIAGNOSIS — B351 Tinea unguium: Secondary | ICD-10-CM

## 2019-08-22 NOTE — Progress Notes (Signed)
This patient returns to the office for evaluation and treatment of long thick painful nails .  This patient is unable to trim his own nails since the patient cannot reach his feet.  Patient says the nails are painful walking and wearing her shoes. She returns for preventive foot care services.  General Appearance  Alert, conversant and in no acute stress.  Vascular  Dorsalis pedis and posterior tibial  pulses are palpable  bilaterally.  Capillary return is within normal limits  bilaterally. Temperature is within normal limits  bilaterally.  Neurologic  Senn-Weinstein monofilament wire test within normal limits  bilaterally. Muscle power within normal limits bilaterally.  Nails Thick disfigured discolored nails with subungual debris  from hallux to fifth toes bilaterally. No evidence of bacterial infection or drainage bilaterally.  Orthopedic  No limitations of motion  feet .  No crepitus or effusions noted.  HAV  B/L.  Hammer toes  B/L.  Skin  normotropic skin with no porokeratosis noted bilaterally.  No signs of infections or ulcers noted.     Onychomycosis  Pain in toes right foot  Pain in toes left foot  Debridement  of nails  1-5  B/L with a nail nipper.  Nails were then filed using a dremel tool with no incidents.    RTC 3 months   Helane Gunther DPM

## 2019-10-17 ENCOUNTER — Ambulatory Visit: Payer: Medicare Other | Admitting: Podiatry

## 2019-11-18 NOTE — Progress Notes (Signed)
[facility] Note  11/19/2019     CHIEF COMPLAINT Patient presents for Retina Evaluation   HISTORY OF PRESENT ILLNESS: Kaitlyn Berry is a 84 y.o. female who presents to the clinic today for:   HPI    Retina Evaluation    In right eye.  This started 2 weeks ago.  Duration of 2 weeks.  Associated Symptoms Distortion.  Context:  distance vision, mid-range vision, near vision and driving.  Treatments tried include eye drops.  Response to treatment was no improvement.  I, the attending physician,  performed the HPI with the patient and updated documentation appropriately.          Comments    84 y/o female pt referred by Dr. [name] 2 days ago for eval of mac hole OD.  Pt reports that her [facility] OD suddenly went poor while driving about 2 wks ago.  No obvious cause, but she had been doing some light lifting recently while assisting an elderly friend.  Denies pain, FOL, floaters.  VA OS decent cc.  Timolol QAM OU.       Last edited by [name], MD on 11/19/2019 12:59 PM. (History)    pt is here on the referral of Dr. [name] for concern of [place], pt states she saw Dr. [name] for routine exam and states she did not notice any change in vision until about a week before her appt with him, pt had cataract sx at [facility]  Referring physician: Gala Romney MD [place], [place] [address]   HISTORICAL INFORMATION:   Selected notes from the MEDICAL RECORD NUMBER Referred by Dr. [name] for macular hole LEE: 11/17/2019 BCVA OD: 20/200 OS: 20/50 Ocular Hx- possible macular hole, pseudophakia OU PMH- HTN    CURRENT MEDICATIONS: Current Outpatient Medications (Ophthalmic Drugs)  Medication Sig  . timolol (TIMOPTIC) 0.5 % ophthalmic solution INSTILL 1 DROP INTO EACH EYE IN THE MORNING  . timolol (BETIMOL) 0.5 % ophthalmic solution 1 drop 2 (two) times daily. (Patient not taking: Reported on 11/19/2019)   No current facility-administered  medications for this visit. (Ophthalmic Drugs)   Current Outpatient Medications (Other)  Medication Sig  . ALPRAZolam (XANAX PO) Take by mouth as needed.  Marland Kitchen amLODipine (NORVASC) 10 MG tablet   . amLODipine (NORVASC) 5 MG tablet   . buPROPion (WELLBUTRIN XL) 150 MG 24 hr tablet Take 150 mg by mouth daily.  . clotrimazole-betamethasone (LOTRISONE) cream   . lamoTRIgine (LAMICTAL) 200 MG tablet Take 200 mg by mouth 2 (two) times daily.  Marland Kitchen losartan-hydrochlorothiazide (HYZAAR) 100-25 MG tablet Take 1 tablet by mouth daily.  . metoprolol succinate (TOPROL-XL) 100 MG 24 hr tablet Take 100 mg by mouth daily.  Marland Kitchen olmesartan-hydrochlorothiazide (BENICAR HCT) 40-25 MG tablet   . omeprazole (PRILOSEC) 20 MG capsule   . potassium chloride (K-DUR) 10 MEQ tablet Take 10 mEq by mouth daily.  . potassium chloride (KLOR-CON) 10 MEQ tablet   . tiZANidine (ZANAFLEX) 4 MG tablet Take 4 mg by mouth 2 (two) times daily as needed.  . ALPRAZolam (XANAX) 0.25 MG tablet  (Patient not taking: Reported on 11/19/2019)   No current facility-administered medications for this visit. (Other)      REVIEW OF SYSTEMS: ROS    Positive for: Eyes   Negative for: Constitutional, Gastrointestinal, Neurological, Skin, Genitourinary, Musculoskeletal, HENT, Endocrine, Cardiovascular, Respiratory, Psychiatric, Allergic/Imm, Heme/Lymph   Last edited by Matthew Folks, COA on 11/19/2019  8:50 AM. (History)       ALLERGIES No Known Allergies  PAST MEDICAL HISTORY Past Medical History:  Diagnosis Date  . Glaucoma    OU  . Macular degeneration    OU   Past Surgical History:  Procedure Laterality Date  . CATARACT EXTRACTION Bilateral   . EYE SURGERY Bilateral    Cat Sx    FAMILY HISTORY History reviewed. No pertinent family history.  SOCIAL HISTORY Social History   Tobacco Use  . Smoking status: Never Smoker  . Smokeless tobacco: Never Used  Substance Use Topics  . Alcohol use: No  . Drug use: No          OPHTHALMIC EXAM:  Base Eye Exam    Visual Acuity (Snellen - Linear)      Right Left   Dist cc 20/80 -2 20/40 -2   Dist ph cc 20/70 +2 NI   Correction: Glasses       Tonometry (Tonopen, 8:54 AM)      Right Left   Pressure 9 10       Pupils      Dark Light Shape React APD   Right 3 2 Round Brisk None   Left 3 2 Round Brisk None       Visual Fields (Counting fingers)      Left Right    Full Full       Extraocular Movement      Right Left    Full, Ortho Full, Ortho       Neuro/Psych    Oriented x3: Yes   Mood/Affect: Normal       Dilation    Both eyes: 1.0% Mydriacyl, 2.5% Phenylephrine @ 8:54 AM        Slit Lamp and Fundus Exam    Slit Lamp Exam      Right Left   Lids/Lashes Dermatochalasis - upper lid, Meibomian gland dysfunction Dermatochalasis - upper lid, Meibomian gland dysfunction   Conjunctiva/Sclera Mild Melanosis Mild Melanosis   Cornea arcus, trace Punctate epithelial erosions arcus, trace Punctate epithelial erosions   Anterior Chamber Deep and quiet Deep and quiet   Iris Round and moderately dilated to 6.31m Round and moderately dilated to 6.053m  Lens 3 piece PC IOl, mild temporal displacement 3 piece PC IOl in good position with open PC   Vitreous Vitreous syneresis, Posterior vitreous detachment, vitreous condensations Vitreous syneresis       Fundus Exam      Right Left   Disc Sharp rim, central cupping/pallor, temporal PPA Sharp rim, central cupping/pallor, temporal PPA, focal PPP   C/D Ratio 0.7 0.75   Macula Blunted foveal reflex, macular hole with surrounding CME, pigment clumping Flat, Blunted foveal reflex, RPE mottling, clumping and early atrophy centrally   Vessels Vascular attenuation, Tortuous Vascular attenuation, Tortuous, mild AV crossing changes   Periphery Attached, midzonal drusen    Attached, midzonal drusen           Refraction    Wearing Rx      Sphere Cylinder Axis Add   Right -1.50 +1.00 175 +2.50   Left -1.25  +1.25 005 +2.50   Age: [age]yr Type: PAL       Manifest Refraction      Sphere Cylinder Axis Dist VA   Right -2.00 +0.50 175 20/60-2   Left -0.50 +1.50 010 20/40          IMAGING AND PROCEDURES  Imaging and Procedures for 11/19/2019  OCT,  Retina - OU - Both Eyes       Right Eye Quality was good. Central Foveal Thickness: 397. Progression has no prior data. Findings include abnormal foveal contour, intraretinal fluid ([place]).   Left Eye Quality was good. Central Foveal Thickness: 187. Progression has no prior data. Findings include normal foveal contour, no IRF, no SRF, outer retinal atrophy, subretinal hyper-reflective material (Focal central ellipsoid disruption and ORA -- ?impending macular hole; mild ERM inferiorly).   Notes *Images captured and stored on drive  Diagnosis / Impression:  OD: FTMH with surrounding CME OS: Focal central ellipsoid disruption and ORA -- ?impending macular hole  Clinical management:  See below  Abbreviations: NFP - Normal foveal profile. CME - cystoid macular edema. PED - pigment epithelial detachment. IRF - intraretinal fluid. SRF - subretinal fluid. EZ - ellipsoid zone. ERM - epiretinal membrane. ORA - outer retinal atrophy. ORT - outer retinal tubulation. SRHM - subretinal hyper-reflective material. IRHM - intraretinal hyper-reflective material                 ASSESSMENT/PLAN:    ICD-10-CM   1. Macular hole of right eye  H35.341   2. Retinal edema  H35.81 OCT, Retina - OU - Both Eyes  3. Essential hypertension  I10   4. Hypertensive retinopathy of both eyes  H35.033   5. Pseudophakia of both eyes  Z96.1   6. Primary open angle glaucoma of both eyes, unspecified glaucoma stage  H40.1130     1,2. Macular hole, right eye  - The natural history, anatomy, potential for loss of vision, and treatment options including vitrectomy techniques and the complications of endophthalmitis, retinal detachment, vitreous hemorrhage, cataract  progression and permanent vision loss discussed with the patient. - recommend 25g PPV w/ membrane peel and gas OD under general anesthesia - RBA of procedure discussed, questions answered - informed consent obtained and signed - case scheduled for 12/04/19 -- [facility] OR - pt will need medical clearance from PCP and pre-op COVID test - f/u Friday, 9.17.21 -- POV1  3,4. Hypertensive retinopathy OU - discussed importance of tight BP control - monitor  5. Pseudophakia OU  - s/p CE/IOL [facility])  - IOL in good position, doing well  - monitor  6. POAG  - under the expert management of Dr. [name]  - IOP today: 9,10  - on timoptic qam OU and latan qhs OU   Ophthalmic Meds Ordered this visit:  No orders of the defined types were placed in this encounter.      Return in about 16 days (around 12/05/2019) for POV.  There are no Patient Instructions on file for this visit.   Explained the diagnoses, plan, and follow up with the patient and they expressed understanding.  Patient expressed understanding of the importance of proper follow up care.   This document serves as a record of services personally performed by Gardiner Sleeper, MD, PhD. It was created on their behalf by Roselee Nova, COMT. The creation of this record is the provider's dictation and/or activities during the visit.  Electronically signed by: Roselee Nova, COMT 11/19/19 1:04 PM   This document serves as a record of services personally performed by Gardiner Sleeper, MD, PhD. It was created on their behalf by San Jetty. Owens Shark, OA an ophthalmic technician. The creation of this record is the provider's dictation and/or activities during the visit.    Electronically signed by: San Jetty. [place], [place] 09.01.2021 1:04 PM   Sharyon Cable.  Coralyn Pear, M.D., Ph.D. Diseases & Surgery of the Retina and [place]  I have reviewed the above documentation for accuracy and completeness, and I agree with the  above. Gardiner Sleeper, M.D., Ph.D. 11/19/19 1:04 PM   Abbreviations: M myopia (nearsighted); A astigmatism; H hyperopia (farsighted); P presbyopia; Mrx spectacle prescription;  CTL contact lenses; OD right eye; OS left eye; OU both eyes  XT exotropia; ET esotropia; PEK punctate epithelial keratitis; PEE punctate epithelial erosions; DES dry eye syndrome; MGD meibomian gland dysfunction; ATs artificial tears; PFAT's preservative free artificial tears; [place] nuclear sclerotic cataract; PSC posterior subcapsular cataract; ERM epi-retinal membrane; PVD posterior vitreous detachment; RD retinal detachment; DM diabetes mellitus; DR diabetic retinopathy; NPDR non-proliferative diabetic retinopathy; PDR proliferative diabetic retinopathy; CSME clinically significant macular edema; DME diabetic macular edema; dbh dot blot hemorrhages; CWS cotton wool spot; POAG primary open angle glaucoma; C/D cup-to-disc ratio; HVF humphrey visual field; GVF goldmann visual field; OCT optical coherence tomography; IOP intraocular pressure; BRVO Branch retinal vein occlusion; CRVO central retinal vein occlusion; CRAO central retinal artery occlusion; BRAO branch retinal artery occlusion; RT retinal tear; SB scleral buckle; PPV pars plana vitrectomy; VH Vitreous hemorrhage; PRP panretinal laser photocoagulation; IVK intravitreal kenalog; VMT vitreomacular traction; MH Macular hole;  NVD neovascularization of the disc; NVE neovascularization elsewhere; AREDS age related eye disease study; ARMD age related macular degeneration; POAG primary open angle glaucoma; EBMD epithelial/anterior basement membrane dystrophy; ACIOL anterior chamber intraocular lens; IOL intraocular lens; PCIOL posterior chamber intraocular lens; Phaco/IOL phacoemulsification with intraocular lens placement; [place] photorefractive keratectomy; LASIK laser assisted in situ keratomileusis; HTN hypertension; DM diabetes mellitus; COPD chronic obstructive pulmonary disease

## 2019-11-19 ENCOUNTER — Encounter (INDEPENDENT_AMBULATORY_CARE_PROVIDER_SITE_OTHER): Payer: Self-pay | Admitting: Ophthalmology

## 2019-11-19 ENCOUNTER — Ambulatory Visit (INDEPENDENT_AMBULATORY_CARE_PROVIDER_SITE_OTHER): Payer: Medicare HMO | Admitting: Ophthalmology

## 2019-11-19 ENCOUNTER — Other Ambulatory Visit: Payer: Self-pay

## 2019-11-19 DIAGNOSIS — H35341 Macular cyst, hole, or pseudohole, right eye: Secondary | ICD-10-CM | POA: Diagnosis not present

## 2019-11-19 DIAGNOSIS — Z961 Presence of intraocular lens: Secondary | ICD-10-CM

## 2019-11-19 DIAGNOSIS — H35033 Hypertensive retinopathy, bilateral: Secondary | ICD-10-CM

## 2019-11-19 DIAGNOSIS — H3581 Retinal edema: Secondary | ICD-10-CM | POA: Diagnosis not present

## 2019-11-19 DIAGNOSIS — I10 Essential (primary) hypertension: Secondary | ICD-10-CM | POA: Diagnosis not present

## 2019-11-19 DIAGNOSIS — H401130 Primary open-angle glaucoma, bilateral, stage unspecified: Secondary | ICD-10-CM

## 2019-11-25 ENCOUNTER — Ambulatory Visit: Payer: Medicare HMO | Admitting: Podiatry

## 2019-11-25 ENCOUNTER — Other Ambulatory Visit: Payer: Self-pay

## 2019-11-25 ENCOUNTER — Encounter: Payer: Self-pay | Admitting: Podiatry

## 2019-11-25 DIAGNOSIS — B351 Tinea unguium: Secondary | ICD-10-CM

## 2019-11-25 DIAGNOSIS — M79676 Pain in unspecified toe(s): Secondary | ICD-10-CM

## 2019-11-25 NOTE — Progress Notes (Signed)
This patient returns to the office for evaluation and treatment of long thick painful nails .  This patient is unable to trim his own nails since the patient cannot reach his feet.  Patient says the nails are painful walking and wearing her shoes. She returns for preventive foot care services.  General Appearance  Alert, conversant and in no acute stress.  Vascular  Dorsalis pedis and posterior tibial  pulses are palpable  bilaterally.  Capillary return is within normal limits  bilaterally. Temperature is within normal limits  bilaterally.  Neurologic  Senn-Weinstein monofilament wire test within normal limits  bilaterally. Muscle power within normal limits bilaterally.  Nails Thick disfigured discolored nails with subungual debris  from hallux to fifth toes bilaterally. No evidence of bacterial infection or drainage bilaterally.  Orthopedic  No limitations of motion  feet .  No crepitus or effusions noted.  HAV  B/L.  Hammer toes  B/L.  Skin  normotropic skin with no porokeratosis noted bilaterally.  No signs of infections or ulcers noted.     Onychomycosis  Pain in toes right foot  Pain in toes left foot  Debridement  of nails  1-5  B/L with a nail nipper.  Nails were then filed using a dremel tool with no incidents.    RTC 10 weeks.  Helane Gunther DPM

## 2019-11-28 NOTE — Progress Notes (Signed)
[facility] Note  12/05/2019     CHIEF COMPLAINT Patient presents for Post-op Follow-up   HISTORY OF PRESENT ILLNESS: Kaitlyn Berry is a 84 y.o. female who presents to the clinic today for:   HPI    Post-op Follow-up    In right eye.  Vision is blurred at distance and is blurred at near.  I, the attending physician,  performed the HPI with the patient and updated documentation appropriately.          Comments    Pt states vision is blurry OD.  Patient denies eye pain or discomfort and denies any new or worsening floaters or fol OU.       Last edited by [name], MD on 12/06/2019 10:27 PM. (History)    pt states last night was okay, she states she slept sitting up and on her stomach, no eye pain  Referring physician: Gala Romney MD [place], [place] [address]   HISTORICAL INFORMATION:   Selected notes from the MEDICAL RECORD NUMBER Referred by Dr. [name] for macular hole LEE: 11/17/2019 BCVA OD: 20/200 OS: 20/50 Ocular Hx- possible macular hole, pseudophakia OU PMH- HTN    CURRENT MEDICATIONS: Current Outpatient Medications (Ophthalmic Drugs)  Medication Sig  . timolol (TIMOPTIC) 0.5 % ophthalmic solution Place 1 drop into both eyes daily.    No current facility-administered medications for this visit. (Ophthalmic Drugs)   Current Outpatient Medications (Other)  Medication Sig  . acetaminophen (TYLENOL) 650 MG CR tablet Take 1,300 mg by mouth every 8 (eight) hours as needed for pain.  Marland Kitchen amLODipine (NORVASC) 5 MG tablet Take 5 mg by mouth 2 (two) times daily.   . Calcium Citrate-Vitamin D (CITRACAL + D PO) Take 600 mg by mouth daily.  . metoprolol succinate (TOPROL-XL) 100 MG 24 hr tablet Take 100 mg by mouth daily.  . Multiple Vitamins-Minerals (PRESERVISION/LUTEIN) CAPS Take 1 capsule by mouth daily.  Marland Kitchen olmesartan-hydrochlorothiazide (BENICAR HCT) 40-25 MG tablet Take 1 tablet by mouth daily.    No  current facility-administered medications for this visit. (Other)      REVIEW OF SYSTEMS: ROS    Positive for: Eyes   Negative for: Constitutional, Gastrointestinal, Neurological, Skin, Genitourinary, Musculoskeletal, HENT, Endocrine, Cardiovascular, Respiratory, Psychiatric, Allergic/Imm, Heme/Lymph   Last edited by Doneen Poisson on 12/05/2019  8:39 AM. (History)       ALLERGIES Allergies  Allergen Reactions  . Sulfa Antibiotics Rash    PAST MEDICAL HISTORY Past Medical History:  Diagnosis Date  . Glaucoma    OU  . Hypertension   . Macular degeneration    OU  . Osteoarthritis of both knees   . Seasonal allergies    Past Surgical History:  Procedure Laterality Date  . [place] VITRECTOMY WITH 20 GAUGE MVR PORT FOR MACULAR HOLE Right 12/04/2019   Procedure: 25 GAUGE PARS PLANA VITRECTOMY WITH 20 GAUGE MVR PORT FOR MACULAR HOLE;  Surgeon: [name], MD;  Location: [place];  Service: Ophthalmology;  Laterality: Right;  . CATARACT EXTRACTION Bilateral   . COLONOSCOPY    . EYE SURGERY Bilateral    Cat Sx  . GAS INSERTION Right 12/04/2019   Procedure: INSERTION OF GAS - C3F8;  Surgeon: [name], MD;  Location: [place];  Service: Ophthalmology;  Laterality: Right;  . GAS/FLUID EXCHANGE Right 12/04/2019   Procedure: GAS/FLUID EXCHANGE;  Surgeon: [name], MD;  Location: [place];  Service:  Ophthalmology;  Laterality: Right;  . MEMBRANE PEEL Right 12/04/2019   Procedure: MEMBRANE PEEL;  Surgeon: [name], MD;  Location: [place];  Service: Ophthalmology;  Laterality: Right;  . PHOTOCOAGULATION WITH LASER Right 12/04/2019   Procedure: PHOTOCOAGULATION WITH LASER;  Surgeon: [name], MD;  Location: [place];  Service: Ophthalmology;  Laterality: Right;  . TONSILLECTOMY     age 71 yrs old  . WISDOM TOOTH EXTRACTION      FAMILY HISTORY History reviewed. No pertinent family history.  SOCIAL HISTORY Social History   Tobacco Use  . Smoking status: Never  Smoker  . Smokeless tobacco: Never Used  Vaping Use  . Vaping Use: Never used  Substance Use Topics  . Alcohol use: No  . Drug use: No         OPHTHALMIC EXAM:  Base Eye Exam    Visual Acuity (Snellen - Linear)      Right Left   Dist No Name CF @ face 20/60 -1   Dist ph [place] NI 20/40 -2       Tonometry (Tonopen, 8:42 AM)      Right Left   Pressure 14 11       Pupils      Dark Light Shape React APD   Right 5 5 Round NR 0   Left 2 1 Round Minimal 0       Extraocular Movement      Right Left    Full Full       Neuro/Psych    Oriented x3: Yes   Mood/Affect: Normal       Dilation    Right eye: 1.0% Mydriacyl, 2.5% Phenylephrine @ 8:42 AM        Slit Lamp and Fundus Exam    Slit Lamp Exam      Right Left   Lids/Lashes Dermatochalasis - upper lid, Meibomian gland dysfunction Dermatochalasis - upper lid, Meibomian gland dysfunction   Conjunctiva/Sclera Mild Melanosis, mild Subconjunctival hemorrhage inferiorly, sutures intact Mild Melanosis   Cornea arcus, 1+ Punctate epithelial erosions, Debris in tear film arcus, trace Punctate epithelial erosions   Anterior Chamber Deep, focal anterior bowing at 0800 Deep and quiet   Iris Round and moderately dilated to 6.66m, Irregular pupil, mild horizontal oval in shape Round and moderately dilated to 6.041m  Lens 3 piece PC IOl, mild temporal displacement, Open posterior capsule 3 piece PC IOl in good position with open PC   Vitreous post vitrectomy, good gas fill Vitreous syneresis       Fundus Exam      Right Left   Disc hazy view, perfused    C/D Ratio 0.7 0.75   Macula Flat under gas with mac hole closing    Vessels Vascular attenuation, Tortuous    Periphery Hazy view, grossly Attached          Refraction    Wearing Rx      Sphere Cylinder Axis Add   Right -1.50 +1.00 175 +2.50   Left -1.25 +1.25 005 +2.50   Type: PAL          IMAGING AND PROCEDURES  Imaging and Procedures for 12/05/2019            ASSESSMENT/PLAN:    ICD-10-CM   1. Macular hole of right eye  H35.341   2. Retinal edema  H35.81   3. Essential hypertension  I10   4. Hypertensive retinopathy of both eyes  H35.033   5. Pseudophakia of both eyes  Z96.1   6. Primary open angle glaucoma of both eyes, unspecified glaucoma stage  H40.1130     1,2. Macular hole, right eye   - POD1 s/p PPV/TissueBlue/MP/14% C3F8 OD, 09.16.21             - doing well this morning             - retina attached and good gas bubble in place w/ mac hole closing             - IOP good at 14  - good gas fill             - cont   PF 4x/day OD                          zymaxid QID OD                          Atropine BID OD                         PSO ung QID OD              - cont face down positioning x3 days then can decrease positioning to 50% of time; avoid laying flat on back              - eye shield when sleeping              - post op drop and positioning instructions reviewed  - tylenol/ibuprofen for pain - f/u 1 week, POV  3,4. Hypertensive retinopathy OU - discussed importance of tight BP control - monitor  5. Pseudophakia OU  - s/p CE/IOL [facility])  - IOL in good position, doing well  - monitor  6. POAG  - under the expert management of Dr. [name]  - IOP today: 14,11  - on timoptic qam OU and latan qhs OU   Ophthalmic Meds Ordered this visit:  No orders of the defined types were placed in this encounter.      Return in about 1 week (around 12/12/2019) for f/u [place] OD, DFE, OCT.  There are no Patient Instructions on file for this visit.   Explained the diagnoses, plan, and follow up with the patient and they expressed understanding.  Patient expressed understanding of the importance of proper follow up care.   This document serves as a record of services personally performed by Gardiner Sleeper, MD, PhD. It was created on their behalf by San Jetty. Owens Shark, OA an ophthalmic technician. The creation of this  record is the provider's dictation and/or activities during the visit.    Electronically signed by: San Jetty. Owens Shark, [place] 09.10.2021 10:31 PM   Gardiner Sleeper, M.D., Ph.D. Diseases & Surgery of the Retina and Vitreous [facility]  I have reviewed the above documentation for accuracy and completeness, and I agree with the above. Gardiner Sleeper, M.D., Ph.D. 12/06/19 10:32 PM   Abbreviations: M myopia (nearsighted); A astigmatism; H hyperopia (farsighted); P presbyopia; Mrx spectacle prescription;  CTL contact lenses; OD right eye; OS left eye; OU both eyes  XT exotropia; ET esotropia; PEK punctate epithelial keratitis; PEE punctate epithelial erosions; DES dry eye syndrome; MGD meibomian gland dysfunction; ATs artificial tears; PFAT's preservative free artificial tears; [place] nuclear sclerotic cataract; PSC posterior subcapsular cataract; ERM epi-retinal membrane; PVD posterior vitreous  detachment; RD retinal detachment; DM diabetes mellitus; DR diabetic retinopathy; NPDR non-proliferative diabetic retinopathy; PDR proliferative diabetic retinopathy; CSME clinically significant macular edema; DME diabetic macular edema; dbh dot blot hemorrhages; CWS cotton wool spot; POAG primary open angle glaucoma; C/D cup-to-disc ratio; HVF humphrey visual field; GVF goldmann visual field; OCT optical coherence tomography; IOP intraocular pressure; BRVO Branch retinal vein occlusion; CRVO central retinal vein occlusion; CRAO central retinal artery occlusion; BRAO branch retinal artery occlusion; RT retinal tear; SB scleral buckle; PPV pars plana vitrectomy; VH Vitreous hemorrhage; PRP panretinal laser photocoagulation; IVK intravitreal kenalog; VMT vitreomacular traction; MH Macular hole;  NVD neovascularization of the disc; NVE neovascularization elsewhere; AREDS age related eye disease study; ARMD age related macular degeneration; POAG primary open angle glaucoma; EBMD epithelial/anterior  basement membrane dystrophy; ACIOL anterior chamber intraocular lens; IOL intraocular lens; PCIOL posterior chamber intraocular lens; Phaco/IOL phacoemulsification with intraocular lens placement; [place] photorefractive keratectomy; LASIK laser assisted in situ keratomileusis; HTN hypertension; DM diabetes mellitus; COPD chronic obstructive pulmonary disease

## 2019-12-01 ENCOUNTER — Other Ambulatory Visit (HOSPITAL_COMMUNITY)
Admission: RE | Admit: 2019-12-01 | Discharge: 2019-12-01 | Disposition: A | Discharge status: Home / Self Care | Payer: Medicare HMO | Source: Ambulatory Visit | Attending: Ophthalmology | Admitting: Ophthalmology

## 2019-12-01 DIAGNOSIS — Z01812 Encounter for preprocedural laboratory examination: Secondary | ICD-10-CM | POA: Insufficient documentation

## 2019-12-01 DIAGNOSIS — Z20822 Contact with and (suspected) exposure to covid-19: Secondary | ICD-10-CM | POA: Insufficient documentation

## 2019-12-01 LAB — SARS CORONAVIRUS 2 (TAT 6-24 HRS): SARS Coronavirus 2: NEGATIVE

## 2019-12-01 NOTE — H&P (Signed)
Kaitlyn Berry is an 84 y.o. female.    Chief Complaint: macular hole OD  HPI: Pt w/ a 2 wk history of decreased vision OD. On dilated exam, was found to have a macular hole of the right eye. After a discussion of the risks benefits and alternatives the patient has elected to proceed with surgical repair of macular hole OD -- 25g PPV w/ ILM peel and gas OD under general anesthesia.  Past Medical History:  Diagnosis Date  . Glaucoma    OU  . Macular degeneration    OU    Past Surgical History:  Procedure Laterality Date  . CATARACT EXTRACTION Bilateral   . EYE SURGERY Bilateral    Cat Sx    No family history on file. Social History:  reports that she has never smoked. She has never used smokeless tobacco. She reports that she does not drink alcohol and does not use drugs.  Allergies: No Known Allergies  No medications prior to admission.    Review of systems otherwise negative  There were no vitals taken for this visit.  Physical exam: Mental status: oriented x3. Eyes: See eye exam associated with this date of surgery Ears, Nose, Throat: within normal limits Neck: Within Normal limits General: within normal limits Chest: Within normal limits Breast: deferred Heart: Within normal limits Abdomen: Within normal limits GU: deferred Extremities: within normal limits Skin: within normal limits  Assessment/Plan 1. Macular hole OD  Plan: To [facility] for 25g PPV w/ membrane peel and gas OD under general anesthesia - case scheduled for Thursday 9.16.21 -- [facility] OR 08  Karie Chimera, M.D., Ph.D. Vitreoretinal Surgeon [facility]

## 2019-12-03 ENCOUNTER — Encounter (HOSPITAL_COMMUNITY): Payer: Self-pay | Admitting: Ophthalmology

## 2019-12-03 NOTE — Progress Notes (Signed)
PCP - Dr Fleet Contras Cardiologist - n/a  Chest x-ray - n/a EKG - 12/04/19 DOS Stress Test - n/a ECHO - n/a Cardiac Cath - n/a  ERAS: clears til 9 am, no drink  STOP now taking any Aspirin (unless otherwise instructed by your surgeon), Aleve, Naproxen, Ibuprofen, Motrin, Advil, Goody's, BC's, all herbal medications, fish oil, and all vitamins.   Coronavirus Screening Covid test on 12/01/19 was negative.  Patient verbalized understanding of instructions that were given via phone.

## 2019-12-04 ENCOUNTER — Ambulatory Visit (HOSPITAL_COMMUNITY)
Admission: RE | Admit: 2019-12-04 | Discharge: 2019-12-04 | Disposition: A | Discharge status: Home / Self Care | Payer: Medicare HMO | Attending: Ophthalmology | Admitting: Ophthalmology

## 2019-12-04 ENCOUNTER — Ambulatory Visit (HOSPITAL_COMMUNITY): Payer: Medicare HMO | Admitting: Anesthesiology

## 2019-12-04 ENCOUNTER — Encounter (HOSPITAL_COMMUNITY)
Admission: RE | Disposition: A | Discharge status: Home / Self Care | Payer: Self-pay | Source: Home / Self Care | Attending: Ophthalmology

## 2019-12-04 ENCOUNTER — Encounter (HOSPITAL_COMMUNITY): Payer: Self-pay | Admitting: Ophthalmology

## 2019-12-04 ENCOUNTER — Other Ambulatory Visit: Payer: Self-pay

## 2019-12-04 DIAGNOSIS — I10 Essential (primary) hypertension: Secondary | ICD-10-CM | POA: Diagnosis not present

## 2019-12-04 DIAGNOSIS — H35341 Macular cyst, hole, or pseudohole, right eye: Secondary | ICD-10-CM

## 2019-12-04 DIAGNOSIS — H409 Unspecified glaucoma: Secondary | ICD-10-CM | POA: Insufficient documentation

## 2019-12-04 DIAGNOSIS — Z9841 Cataract extraction status, right eye: Secondary | ICD-10-CM | POA: Insufficient documentation

## 2019-12-04 DIAGNOSIS — M1990 Unspecified osteoarthritis, unspecified site: Secondary | ICD-10-CM | POA: Insufficient documentation

## 2019-12-04 DIAGNOSIS — Z9842 Cataract extraction status, left eye: Secondary | ICD-10-CM | POA: Insufficient documentation

## 2019-12-04 HISTORY — DX: Bilateral primary osteoarthritis of knee: M17.0

## 2019-12-04 HISTORY — DX: Other seasonal allergic rhinitis: J30.2

## 2019-12-04 HISTORY — DX: Essential (primary) hypertension: I10

## 2019-12-04 HISTORY — PX: GAS/FLUID EXCHANGE: SHX5334

## 2019-12-04 HISTORY — PX: EYE SURGERY: SHX253

## 2019-12-04 HISTORY — PX: 25 GAUGE PARS PLANA VITRECTOMY WITH 20 GAUGE MVR PORT FOR MACULAR HOLE: SHX6096

## 2019-12-04 HISTORY — PX: MEMBRANE PEEL: SHX5967

## 2019-12-04 HISTORY — PX: GAS INSERTION: SHX5336

## 2019-12-04 HISTORY — PX: PHOTOCOAGULATION WITH LASER: SHX6027

## 2019-12-04 LAB — CBC
HCT: 34.9 % — ABNORMAL LOW (ref 36.0–46.0)
Hemoglobin: 11.3 g/dL — ABNORMAL LOW (ref 12.0–15.0)
MCH: 31.3 pg (ref 26.0–34.0)
MCHC: 32.4 g/dL (ref 30.0–36.0)
MCV: 96.7 fL (ref 80.0–100.0)
Platelets: 273 10*3/uL (ref 150–400)
RBC: 3.61 MIL/uL — ABNORMAL LOW (ref 3.87–5.11)
RDW: 12.3 % (ref 11.5–15.5)
WBC: 6.4 10*3/uL (ref 4.0–10.5)
nRBC: 0 % (ref 0.0–0.2)

## 2019-12-04 LAB — BASIC METABOLIC PANEL
Anion gap: 12 (ref 5–15)
BUN: 14 mg/dL (ref 8–23)
CO2: 23 mmol/L (ref 22–32)
Calcium: 9.2 mg/dL (ref 8.9–10.3)
Chloride: 103 mmol/L (ref 98–111)
Creatinine, Ser: 0.83 mg/dL (ref 0.44–1.00)
GFR calc Af Amer: >60 mL/min (ref >60)
GFR calc non Af Amer: >60 mL/min (ref >60)
Glucose, Bld: 124 mg/dL — ABNORMAL HIGH (ref 70–99)
Potassium: 3.3 mmol/L — ABNORMAL LOW (ref 3.5–5.1)
Sodium: 138 mmol/L (ref 135–145)

## 2019-12-04 SURGERY — 25 GAUGE PARS PLANA VITRECTOMY WITH 20 GAUGE MVR PORT FOR MACULAR HOLE
Anesthesia: General | Site: Eye | Laterality: Right

## 2019-12-04 MED ORDER — BACITRACIN-POLYMYXIN B 500-10000 UNIT/GM OP OINT
TOPICAL_OINTMENT | OPHTHALMIC | Status: AC
  Filled 2019-12-04: qty 3.5

## 2019-12-04 MED ORDER — BACITRACIN-POLYMYXIN B 500-10000 UNIT/GM OP OINT
TOPICAL_OINTMENT | OPHTHALMIC | Status: DC | PRN
  Administered 2019-12-04: 1 via OPHTHALMIC

## 2019-12-04 MED ORDER — HYALURONIDASE HUMAN 150 UNIT/ML IJ SOLN
INTRAMUSCULAR | Status: AC
  Filled 2019-12-04: qty 1

## 2019-12-04 MED ORDER — BRILLIANT BLUE G 0.025 % IO SOSY
PREFILLED_SYRINGE | INTRAOCULAR | Status: DC | PRN
  Administered 2019-12-04: 0.5 mL via INTRAVITREAL

## 2019-12-04 MED ORDER — PREDNISOLONE ACETATE 1 % OP SUSP
OPHTHALMIC | Status: DC | PRN
  Administered 2019-12-04: 1 [drp] via OPHTHALMIC

## 2019-12-04 MED ORDER — SODIUM CHLORIDE (PF) 0.9 % IJ SOLN
INTRAMUSCULAR | Status: AC
  Filled 2019-12-04: qty 10

## 2019-12-04 MED ORDER — POLYMYXIN B SULFATE 500000 UNITS IJ SOLR
INTRAMUSCULAR | Status: AC
  Filled 2019-12-04: qty 500000

## 2019-12-04 MED ORDER — DEXTROSE 5 % IV SOLN
INTRAVENOUS | Status: AC
  Filled 2019-12-04: qty 100

## 2019-12-04 MED ORDER — EPHEDRINE 5 MG/ML INJ
INTRAVENOUS | Status: AC
  Filled 2019-12-04: qty 10

## 2019-12-04 MED ORDER — FENTANYL CITRATE (PF) 250 MCG/5ML IJ SOLN
INTRAMUSCULAR | Status: AC
  Filled 2019-12-04: qty 5

## 2019-12-04 MED ORDER — NA CHONDROIT SULF-NA HYALURON 40-30 MG/ML IO SOLN
INTRAOCULAR | Status: DC | PRN
  Administered 2019-12-04: 0.5 mL via INTRAOCULAR

## 2019-12-04 MED ORDER — DEXAMETHASONE SODIUM PHOSPHATE 10 MG/ML IJ SOLN
INTRAMUSCULAR | Status: DC | PRN
  Administered 2019-12-04: 5 mg via INTRAVENOUS

## 2019-12-04 MED ORDER — FENTANYL CITRATE (PF) 100 MCG/2ML IJ SOLN: 25.0000 ug | INTRAMUSCULAR | Status: DC | PRN

## 2019-12-04 MED ORDER — BRIMONIDINE TARTRATE 0.2 % OP SOLN
OPHTHALMIC | Status: DC | PRN
  Administered 2019-12-04: 1 [drp] via OPHTHALMIC

## 2019-12-04 MED ORDER — DORZOLAMIDE HCL-TIMOLOL MAL 2-0.5 % OP SOLN
OPHTHALMIC | Status: AC
  Filled 2019-12-04: qty 10

## 2019-12-04 MED ORDER — TROPICAMIDE 1 % OP SOLN
OPHTHALMIC | Status: AC
  Administered 2019-12-04: 1 [drp] via OPHTHALMIC
  Filled 2019-12-04: qty 15

## 2019-12-04 MED ORDER — EPINEPHRINE PF 1 MG/ML IJ SOLN
INTRAMUSCULAR | Status: AC
  Filled 2019-12-04: qty 1

## 2019-12-04 MED ORDER — ATROPINE SULFATE 1 % OP SOLN
OPHTHALMIC | Status: AC
  Filled 2019-12-04: qty 5

## 2019-12-04 MED ORDER — FENTANYL CITRATE (PF) 250 MCG/5ML IJ SOLN
INTRAMUSCULAR | Status: DC | PRN
Start: 2019-12-04 — End: 2019-12-04
  Administered 2019-12-04: 25 ug via INTRAVENOUS
  Administered 2019-12-04: 50 ug via INTRAVENOUS

## 2019-12-04 MED ORDER — PHENYLEPHRINE HCL 10 % OP SOLN
OPHTHALMIC | Status: AC
  Administered 2019-12-04: 1 [drp] via OPHTHALMIC
  Filled 2019-12-04: qty 5

## 2019-12-04 MED ORDER — DEXAMETHASONE SODIUM PHOSPHATE 10 MG/ML IJ SOLN
INTRAMUSCULAR | Status: AC
  Filled 2019-12-04: qty 1

## 2019-12-04 MED ORDER — DROPERIDOL 2.5 MG/ML IJ SOLN
INTRAMUSCULAR | Status: DC | PRN
  Administered 2019-12-04: .625 mg via INTRAVENOUS

## 2019-12-04 MED ORDER — TRIAMCINOLONE ACETONIDE 40 MG/ML IJ SUSP
INTRAMUSCULAR | Status: AC
  Filled 2019-12-04: qty 5

## 2019-12-04 MED ORDER — BSS IO SOLN
INTRAOCULAR | Status: AC
  Filled 2019-12-04: qty 15

## 2019-12-04 MED ORDER — NA CHONDROIT SULF-NA HYALURON 40-30 MG/ML IO SOLN
INTRAOCULAR | Status: AC
  Filled 2019-12-04: qty 1

## 2019-12-04 MED ORDER — SUGAMMADEX SODIUM 200 MG/2ML IV SOLN
INTRAVENOUS | Status: DC | PRN
  Administered 2019-12-04: 200 mg via INTRAVENOUS

## 2019-12-04 MED ORDER — ATROPINE SULFATE 1 % OP SOLN
1.0000 [drp] | OPHTHALMIC | Status: AC | PRN
  Administered 2019-12-04 (×3): 1 [drp] via OPHTHALMIC
  Filled 2019-12-04: qty 5

## 2019-12-04 MED ORDER — STERILE WATER FOR INJECTION IJ SOLN
INTRAMUSCULAR | Status: DC | PRN
  Administered 2019-12-04: 20 mL

## 2019-12-04 MED ORDER — ACETAZOLAMIDE SODIUM 500 MG IJ SOLR
INTRAMUSCULAR | Status: AC
  Filled 2019-12-04: qty 500

## 2019-12-04 MED ORDER — LACTATED RINGERS IV SOLN: INTRAVENOUS | Status: DC

## 2019-12-04 MED ORDER — LIDOCAINE 2% (20 MG/ML) 5 ML SYRINGE
INTRAMUSCULAR | Status: AC
  Filled 2019-12-04: qty 5

## 2019-12-04 MED ORDER — STERILE WATER FOR INJECTION IJ SOLN
INTRAMUSCULAR | Status: AC
  Filled 2019-12-04: qty 10

## 2019-12-04 MED ORDER — INDOCYANINE GREEN 25 MG IV SOLR
INTRAVENOUS | Status: AC
  Filled 2019-12-04: qty 10

## 2019-12-04 MED ORDER — TOBRAMYCIN-DEXAMETHASONE 0.3-0.1 % OP OINT
TOPICAL_OINTMENT | OPHTHALMIC | Status: AC
  Filled 2019-12-04: qty 3.5

## 2019-12-04 MED ORDER — EPINEPHRINE PF 1 MG/ML IJ SOLN
INTRAOCULAR | Status: DC | PRN
  Administered 2019-12-04: 500 mL

## 2019-12-04 MED ORDER — SODIUM CHLORIDE 0.9 % IV SOLN: INTRAVENOUS | Status: DC

## 2019-12-04 MED ORDER — BUPIVACAINE HCL (PF) 0.75 % IJ SOLN
INTRAMUSCULAR | Status: AC
  Filled 2019-12-04: qty 10

## 2019-12-04 MED ORDER — LIDOCAINE 2% (20 MG/ML) 5 ML SYRINGE
INTRAMUSCULAR | Status: DC | PRN
  Administered 2019-12-04: 40 mg via INTRAVENOUS

## 2019-12-04 MED ORDER — CARBACHOL 0.01 % IO SOLN
INTRAOCULAR | Status: AC
  Filled 2019-12-04: qty 1.5

## 2019-12-04 MED ORDER — PROPOFOL 10 MG/ML IV BOLUS
INTRAVENOUS | Status: AC
  Filled 2019-12-04: qty 20

## 2019-12-04 MED ORDER — CHLORHEXIDINE GLUCONATE 0.12 % MT SOLN: 15.0000 mL | Freq: Once | OROMUCOSAL | Status: AC

## 2019-12-04 MED ORDER — CEFTAZIDIME 1 G IJ SOLR
INTRAMUSCULAR | Status: AC
  Filled 2019-12-04: qty 1

## 2019-12-04 MED ORDER — PREDNISOLONE ACETATE 1 % OP SUSP
OPHTHALMIC | Status: AC
  Filled 2019-12-04: qty 5

## 2019-12-04 MED ORDER — GATIFLOXACIN 0.5 % OP SOLN
OPHTHALMIC | Status: AC
  Filled 2019-12-04: qty 2.5

## 2019-12-04 MED ORDER — PROPOFOL 10 MG/ML IV BOLUS
INTRAVENOUS | Status: DC | PRN
  Administered 2019-12-04: 90 mg via INTRAVENOUS

## 2019-12-04 MED ORDER — BRIMONIDINE TARTRATE 0.2 % OP SOLN
OPHTHALMIC | Status: AC
  Filled 2019-12-04: qty 5

## 2019-12-04 MED ORDER — DORZOLAMIDE HCL-TIMOLOL MAL 2-0.5 % OP SOLN
OPHTHALMIC | Status: DC | PRN
  Administered 2019-12-04: 1 [drp] via OPHTHALMIC

## 2019-12-04 MED ORDER — ROCURONIUM BROMIDE 10 MG/ML (PF) SYRINGE
PREFILLED_SYRINGE | INTRAVENOUS | Status: DC | PRN
  Administered 2019-12-04: 10 mg via INTRAVENOUS
  Administered 2019-12-04: 50 mg via INTRAVENOUS

## 2019-12-04 MED ORDER — LIDOCAINE HCL 2 % IJ SOLN
INTRAMUSCULAR | Status: AC
  Filled 2019-12-04: qty 20

## 2019-12-04 MED ORDER — ONDANSETRON HCL 4 MG/2ML IJ SOLN
INTRAMUSCULAR | Status: DC | PRN
  Administered 2019-12-04: 4 mg via INTRAVENOUS

## 2019-12-04 MED ORDER — CHLORHEXIDINE GLUCONATE 0.12 % MT SOLN
OROMUCOSAL | Status: AC
  Administered 2019-12-04: 15 mL via OROMUCOSAL
  Filled 2019-12-04: qty 15

## 2019-12-04 MED ORDER — PHENYLEPHRINE 40 MCG/ML (10ML) SYRINGE FOR IV PUSH (FOR BLOOD PRESSURE SUPPORT)
PREFILLED_SYRINGE | INTRAVENOUS | Status: DC | PRN
  Administered 2019-12-04: 40 ug via INTRAVENOUS

## 2019-12-04 MED ORDER — GATIFLOXACIN 0.5 % OP SOLN OPTIME - NO CHARGE
OPHTHALMIC | Status: DC | PRN
  Administered 2019-12-04: 1 [drp] via OPHTHALMIC

## 2019-12-04 MED ORDER — ROCURONIUM BROMIDE 10 MG/ML (PF) SYRINGE
PREFILLED_SYRINGE | INTRAVENOUS | Status: AC
  Filled 2019-12-04: qty 10

## 2019-12-04 MED ORDER — BSS PLUS IO SOLN
INTRAOCULAR | Status: AC
  Filled 2019-12-04: qty 500

## 2019-12-04 MED ORDER — ONDANSETRON HCL 4 MG/2ML IJ SOLN
INTRAMUSCULAR | Status: AC
  Filled 2019-12-04: qty 2

## 2019-12-04 MED ORDER — PHENYLEPHRINE HCL 10 % OP SOLN
1.0000 [drp] | OPHTHALMIC | Status: AC | PRN
  Administered 2019-12-04 (×2): 1 [drp] via OPHTHALMIC

## 2019-12-04 MED ORDER — TRIAMCINOLONE ACETONIDE 40 MG/ML IJ SUSP
INTRAMUSCULAR | Status: DC | PRN
  Administered 2019-12-04: 20 mg

## 2019-12-04 MED ORDER — ORAL CARE MOUTH RINSE: 15.0000 mL | Freq: Once | OROMUCOSAL | Status: AC

## 2019-12-04 MED ORDER — PROPARACAINE HCL 0.5 % OP SOLN
1.0000 [drp] | OPHTHALMIC | Status: AC | PRN
  Administered 2019-12-04 (×3): 1 [drp] via OPHTHALMIC
  Filled 2019-12-04: qty 15

## 2019-12-04 MED ORDER — TROPICAMIDE 1 % OP SOLN
1.0000 [drp] | OPHTHALMIC | Status: AC | PRN
  Administered 2019-12-04 (×2): 1 [drp] via OPHTHALMIC

## 2019-12-04 SURGICAL SUPPLY — 56 items
APL SWBSTK 6 STRL LF DISP (MISCELLANEOUS) ×4
APPLICATOR COTTON TIP 6 STRL (MISCELLANEOUS) ×4 IMPLANT
APPLICATOR COTTON TIP 6IN STRL (MISCELLANEOUS) ×12
BAND WRIST GAS GREEN (MISCELLANEOUS) ×1 IMPLANT
BLADE EYE CATARACT 19 1.4 BEAV (BLADE) IMPLANT
BNDG EYE OVAL (GAUZE/BANDAGES/DRESSINGS) ×3 IMPLANT
CABLE BIPOLOR RESECTION CORD (MISCELLANEOUS) IMPLANT
CANNULA ANT CHAM MAIN (OPHTHALMIC RELATED) IMPLANT
CANNULA FLEX TIP 25G (CANNULA) ×3 IMPLANT
CLOSURE STERI-STRIP 1/2X4 (GAUZE/BANDAGES/DRESSINGS) ×1
CLSR STERI-STRIP ANTIMIC 1/2X4 (GAUZE/BANDAGES/DRESSINGS) ×2 IMPLANT
DRAPE MICROSCOPE LEICA 46X105 (MISCELLANEOUS) ×3 IMPLANT
DRAPE OPHTHALMIC 77X100 STRL (CUSTOM PROCEDURE TRAY) ×3 IMPLANT
ERASER HMR WETFIELD 23G BP (MISCELLANEOUS) IMPLANT
FILTER BLUE MILLIPORE (MISCELLANEOUS) IMPLANT
FILTER STRAW FLUID ASPIR (MISCELLANEOUS) IMPLANT
FORCEPS GRIESHABER ILM 25G A (INSTRUMENTS) IMPLANT
FORCEPS GRIESHABER MAX 25G (MISCELLANEOUS) IMPLANT
GAS AUTO FILL CONSTEL (OPHTHALMIC) ×3
GAS AUTO FILL CONSTELLATION (OPHTHALMIC) ×1 IMPLANT
GAS WRIST BAND GREEN (MISCELLANEOUS) ×3
GLOVE BIO SURGEON STRL SZ7.5 (GLOVE) ×6 IMPLANT
GLOVE BIOGEL M 7.0 STRL (GLOVE) ×3 IMPLANT
GOWN STRL REUS W/ TWL LRG LVL3 (GOWN DISPOSABLE) ×4 IMPLANT
GOWN STRL REUS W/ TWL XL LVL3 (GOWN DISPOSABLE) ×1 IMPLANT
GOWN STRL REUS W/TWL LRG LVL3 (GOWN DISPOSABLE) ×12
GOWN STRL REUS W/TWL XL LVL3 (GOWN DISPOSABLE) ×3
KIT BASIN OR (CUSTOM PROCEDURE TRAY) ×3 IMPLANT
KIT PERFLUORON PROCEDURE 5ML (MISCELLANEOUS) IMPLANT
LENS VITRECTOMY FLAT OCLR DISP (MISCELLANEOUS) IMPLANT
LOOP FINESSE 25 GA (MISCELLANEOUS) IMPLANT
MICROPICK 25G (MISCELLANEOUS)
NEEDLE 18GX1X1/2 (RX/OR ONLY) (NEEDLE) ×9 IMPLANT
NEEDLE 25GX 5/8IN NON SAFETY (NEEDLE) ×6 IMPLANT
NEEDLE HYPO 30X.5 LL (NEEDLE) IMPLANT
PACK VITRECTOMY CUSTOM (CUSTOM PROCEDURE TRAY) ×3 IMPLANT
PAD ARMBOARD 7.5X6 YLW CONV (MISCELLANEOUS) ×6 IMPLANT
PAK PIK VITRECTOMY CVS 25GA (OPHTHALMIC) ×3 IMPLANT
PENCIL BIPOLAR 25GA STR DISP (OPHTHALMIC RELATED) IMPLANT
PICK MICROPICK 25G (MISCELLANEOUS) IMPLANT
PROBE ENDO DIATHERMY 25G (MISCELLANEOUS) IMPLANT
PROBE LASER ILLUM FLEX CVD 25G (OPHTHALMIC) ×3 IMPLANT
REPL STRA BRUSH NEEDLE (NEEDLE) IMPLANT
RESERVOIR BACK FLUSH (MISCELLANEOUS) IMPLANT
RETRACTOR IRIS FLEX 25G GRIESH (INSTRUMENTS) IMPLANT
SHIELD EYE LENSE ONLY DISP (GAUZE/BANDAGES/DRESSINGS) ×3 IMPLANT
STOPCOCK 4 WAY LG BORE MALE ST (IV SETS) IMPLANT
SUT PLAIN 6 0 TG1408 (SUTURE) IMPLANT
SUT VICRYL 7 0 TG140 8 (SUTURE) ×3 IMPLANT
SYR 10ML LL (SYRINGE) ×6 IMPLANT
SYR 20ML LL LF (SYRINGE) IMPLANT
SYR 5ML LL (SYRINGE) IMPLANT
SYR TB 1ML LUER SLIP (SYRINGE) ×3 IMPLANT
TOWEL GREEN STERILE FF (TOWEL DISPOSABLE) ×3 IMPLANT
TUBING HIGH PRESS EXTEN 6IN (TUBING) ×3 IMPLANT
WATER STERILE IRR 1000ML POUR (IV SOLUTION) ×3 IMPLANT

## 2019-12-04 NOTE — Op Note (Signed)
Date of procedure:  12/04/2019   Surgeon: [name], MD, PhD   Assistant: Ernest Mallick, Ophthalmic Assistant   Pre-operative Diagnoses: Full thickness macular hole, Right Eye   Post-operative diagnosis: Full thickness macular hole, Right Eye   Anesthesia: General   Procedure:   1. 25 gauge pars plana vitrectomy, Right Eye 2. TissueBlue stain, Right Eye 3. Internal Limiting Membrane peel, Right Eye  4. Endolaser, Right Eye 5. Injection 14% C3F8, Right Eye     Indications for procedure: The patient presented with a full thickness macular hole, which was caught on routine eye exam. After discussing the risks, benefits, and alternatives to surgery, the patient electively decided to undergo surgical repair and informed consent was obtained.  The surgery was an attempt to close the macular hole and potentially improve the vision within the reasonable expectations of the surgeon.    Procedure in Detail:    The patient was met in the pre-operative holding area where their identification data was verified.  It was noted that there was a signed, informed consent in the chart and the Right Eye eye was verbally verified by the patient as the operative eye and was marked with a marking pen. The patient was then taken to the operating room and placed in the supine position. General endotracheal anesthesia was induced.   The eye was then prepped with 5% betadine and draped in the normal fashion for ophthalmic surgery. The microscope was draped and swung into position, and a secondary time-out was performed to identify the correct patient, eye, procedures, and any allergies.   A 25 gauge trocar was inserted in a 30-45 degrees fashion into the inferotemporal quadrant 3.5 mm posterior to the limbus in this pseudophakic patient.  Correct positioning within the vitreous was verified externally with the light pipe.  The infusion was then connected to the cannula and BSS infusion was commenced.  Additional  ports were placed in the superonasal and superotemporal quadrants. Viscoat was placed on the cornea. The BIOM was used to visualize the posterior segment while the core vitrectomy was completed. The patient had a visible full thickness macular hole and a posterior vitreous detachment was confirmed using suction over the optic nerve head and lifting anteriorly. The remaining vitreous was removed. Kenalog was used to aid in this process. A thorough peripheral vitrectomy was performed.      TissueBlue was then used to stain the internal limiting membrane. A macular contact lens was placed on the eye. End-grasping ILM forceps were used to create an opening in the ILM and the ILM was peeled fully from the macula taking care to avoid traction on the macular hole.    The wide angle viewing system was brought back into position. Scleral depression was performed and used to shave the vitreous base and to inspect the peripheral retina. On 360 inspection, there were scattered VR tufts within the vitreous base and a retinal break at 0500 ora. Endolaser was placed around these retinal defects and 360 just posterior to ora for prophylaxis under scleral depression. An air fluid exchange was performed.   The superotemporal port was then removed and sutured with 7-0 vicryl, there was no leakage. 14% C3F8 gas was connected to the infusion line and the gas was injected into the posterior segment while venting air through the superonasal trocar using the extrusion cannula. Once a full, 40cc of gas was vented through the eye, the infusion port and venting ports were removed and they were sutured with 7-0  vicryl.  There was no leakage from the sclerotomy sites.   Subconjunctival injections of kefzol + bacitracin + polymixin b and kenalog were then administered, and antibiotic and steroid drops as well as antibiotic ointment were placed in the eye.  The drapes were removed and the eye was patched and shielded.  A green gas  bracelet was placed on the patient's wrist. The patient was then taken to the post-operative area for recovery having tolerated the procedure well.  She was instructed to perform face down positioning postoperatively and to follow up in clinic the following morning as scheduled.   Estimate blood lost: none Complications: None

## 2019-12-04 NOTE — Interval H&P Note (Signed)
History and Physical Interval Note:  12/04/2019 10:35 AM  Kaitlyn Berry  has presented today for surgery, with the diagnosis of macular hole.  The various methods of treatment have been discussed with the patient and family. After consideration of risks, benefits and other options for treatment, the patient has consented to  Procedure(s): 25 GAUGE PARS PLANA VITRECTOMY WITH 20 GAUGE MVR PORT FOR MACULAR HOLE (Right) as a surgical intervention.  The patient's history has been reviewed, patient examined, no change in status, stable for surgery.  I have reviewed the patient's chart and labs.  Questions were answered to the patient's satisfaction.     Rennis Chris

## 2019-12-04 NOTE — Discharge Instructions (Addendum)
POSTOPERATIVE INSTRUCTIONS  Your doctor has performed vitreoretinal surgery on you at [facility].  - Keep eye patched and shielded until seen by Dr. Zamora 815 AM tomorrow in clinic - Do not use drops until return - FACE DOWN POSITIONING WHILE AWAKE - Sleep with belly down or on left side, avoid laying flat on back.    - No strenuous bending, stooping or lifting.  - You may not drive until further notice.  - If your doctor used a gas bubble in your eye during the procedure he will advise you on postoperative positioning. If you have a gas bubble you will be wearing a green bracelet that was applied in the operating room. The green bracelet should stay on as long as the gas bubble is in your eye. While the gas bubble is present you should not fly in an airplane. If you require general anesthesia while the gas bubble is present you must notify your anesthesiologist that an intraocular gas bubble is present so he can take the appropriate precautions.  - Tylenol or any other over-the-counter pain reliever can be used according to your doctor. If more pain medicine is required, your doctor will have a prescription for you.  - You may read, go up and down stairs, and watch television.     Brian Zamora, M.D., Ph.D.  

## 2019-12-04 NOTE — Brief Op Note (Signed)
12/04/2019  2:03 PM  PATIENT:  Kaitlyn Berry  84 y.o. female  PRE-OPERATIVE DIAGNOSIS:  macular hole, RIGHT EYE  POST-OPERATIVE DIAGNOSIS:  macular hole, RIGHT EYE  PROCEDURE:  Procedure(s): 25 GAUGE PARS PLANA VITRECTOMY WITH 20 GAUGE MVR PORT FOR MACULAR HOLE (Right) INSERTION OF GAS - C3F8 (Right) GAS/FLUID EXCHANGE (Right) MEMBRANE PEEL (Right) PHOTOCOAGULATION WITH LASER (Right)  SURGEON:  Surgeon(s) and Role:    Rennis Chris, MD - Primary  ASSISTANTS: Laurian Brim, Ophthalmic Assistant    ANESTHESIA:   general  EBL:  5 mL   BLOOD ADMINISTERED:none  DRAINS: none   LOCAL MEDICATIONS USED:  NONE  SPECIMEN:  No Specimen  DISPOSITION OF SPECIMEN:  N/A  COUNTS:  YES  TOURNIQUET:  * No tourniquets in log *  DICTATION: .Note written in EPIC  PLAN OF CARE: Discharge to home after PACU  PATIENT DISPOSITION:  PACU - hemodynamically stable.   Delay start of Pharmacological VTE agent (>24hrs) due to surgical blood loss or risk of bleeding: not applicable

## 2019-12-04 NOTE — Anesthesia Preprocedure Evaluation (Addendum)
Anesthesia Evaluation  Patient identified by MRN, date of birth, ID band Patient awake    Reviewed: Allergy & Precautions, NPO status , Patient's Chart, lab work & pertinent test results, reviewed documented beta blocker date and time   History of Anesthesia Complications Negative for: history of anesthetic complications  Airway Mallampati: II  TM Distance: >3 FB Neck ROM: Full    Dental  (+) Dental Advisory Given, Missing, Caps   Pulmonary neg pulmonary ROS,  12/01/2019 SARS coronavirus NEG   breath sounds clear to auscultation       Cardiovascular hypertension, Pt. on medications and Pt. on home beta blockers (-) angina Rhythm:Regular Rate:Normal     Neuro/Psych glaucoma    GI/Hepatic negative GI ROS, Neg liver ROS,   Endo/Other  negative endocrine ROS  Renal/GU negative Renal ROS     Musculoskeletal  (+) Arthritis , Osteoarthritis,    Abdominal   Peds  Hematology negative hematology ROS (+)   Anesthesia Other Findings   Reproductive/Obstetrics                            Anesthesia Physical Anesthesia Plan  ASA: III  Anesthesia Plan: General   Post-op Pain Management:    Induction: Intravenous  PONV Risk Score and Plan: 3 and Ondansetron, Dexamethasone and Droperidol  Airway Management Planned: Oral ETT  Additional Equipment: None  Intra-op Plan:   Post-operative Plan: Extubation in OR  Informed Consent: I have reviewed the patients History and Physical, chart, labs and discussed the procedure including the risks, benefits and alternatives for the proposed anesthesia with the patient or authorized representative who has indicated his/her understanding and acceptance.     Dental advisory given  Plan Discussed with: CRNA and Surgeon  Anesthesia Plan Comments:        Anesthesia Quick Evaluation

## 2019-12-04 NOTE — Anesthesia Procedure Notes (Signed)
Procedure Name: Intubation Date/Time: 12/04/2019 11:59 AM Performed by: Kyung Rudd, CRNA Pre-anesthesia Checklist: Patient identified, Emergency Drugs available, Suction available and Patient being monitored Patient Re-evaluated:Patient Re-evaluated prior to induction Oxygen Delivery Method: Circle System Utilized Preoxygenation: Pre-oxygenation with 100% oxygen Induction Type: IV induction Ventilation: Oral airway inserted - appropriate to patient size and Mask ventilation with difficulty Laryngoscope Size: Mac and 3 Grade View: Grade I Tube type: Oral Tube size: 7.0 mm Number of attempts: 1 Airway Equipment and Method: Stylet and Oral airway Placement Confirmation: ETT inserted through vocal cords under direct vision,  positive ETCO2 and breath sounds checked- equal and bilateral Secured at: 21 cm Tube secured with: Tape Dental Injury: Teeth and Oropharynx as per pre-operative assessment  Comments: Placed by G. Sonia Baller

## 2019-12-04 NOTE — Transfer of Care (Signed)
Immediate Anesthesia Transfer of Care Note  Patient: Kaitlyn Berry  Procedure(s) Performed: 25 GAUGE PARS PLANA VITRECTOMY WITH 20 GAUGE MVR PORT FOR MACULAR HOLE (Right Eye) INSERTION OF GAS - C3F8 (Right Eye) GAS/FLUID EXCHANGE (Right Eye) MEMBRANE PEEL (Right Eye) PHOTOCOAGULATION WITH LASER (Right Eye)  Patient Location: PACU  Anesthesia Type:General  Level of Consciousness: awake, alert  and oriented  Airway & Oxygen Therapy: Patient Spontanous Breathing and Patient connected to face mask oxygen  Post-op Assessment: Report given to RN, Post -op Vital signs reviewed and stable and Patient moving all extremities  Post vital signs: Reviewed and stable  Last Vitals:  Vitals Value Taken Time  BP    Temp    Pulse 69 12/04/19 1355  Resp 15 12/04/19 1355  SpO2 100 % 12/04/19 1355  Vitals shown include unvalidated device data.  Last Pain:  Vitals:   12/04/19 1030  TempSrc:   PainSc: 0-No pain         Complications: No complications documented.

## 2019-12-04 NOTE — Anesthesia Postprocedure Evaluation (Signed)
Anesthesia Post Note  Patient: Kaitlyn Berry  Procedure(s) Performed: 25 GAUGE PARS PLANA VITRECTOMY WITH 20 GAUGE MVR PORT FOR MACULAR HOLE (Right Eye) INSERTION OF GAS - C3F8 (Right Eye) GAS/FLUID EXCHANGE (Right Eye) MEMBRANE PEEL (Right Eye) PHOTOCOAGULATION WITH LASER (Right Eye)     Patient location during evaluation: PACU Anesthesia Type: General Level of consciousness: awake and alert Pain management: pain level controlled Vital Signs Assessment: post-procedure vital signs reviewed and stable Respiratory status: spontaneous breathing, nonlabored ventilation and respiratory function stable Cardiovascular status: blood pressure returned to baseline and stable Postop Assessment: no apparent nausea or vomiting Anesthetic complications: no   No complications documented.  Last Vitals:  Vitals:   12/04/19 1355 12/04/19 1410  BP: (!) 171/76 (!) 154/87  Pulse: 69 75  Resp: 15 17  Temp: (!) 36.2 C 36.4 C  SpO2: 100% 97%    Last Pain:  Vitals:   12/04/19 1410  TempSrc:   PainSc: Asleep                 Xavien Dauphinais,W. EDMOND

## 2019-12-05 ENCOUNTER — Encounter (HOSPITAL_COMMUNITY): Payer: Self-pay | Admitting: Ophthalmology

## 2019-12-05 ENCOUNTER — Ambulatory Visit (INDEPENDENT_AMBULATORY_CARE_PROVIDER_SITE_OTHER): Payer: Medicare HMO | Admitting: Ophthalmology

## 2019-12-05 DIAGNOSIS — H35033 Hypertensive retinopathy, bilateral: Secondary | ICD-10-CM

## 2019-12-05 DIAGNOSIS — H35341 Macular cyst, hole, or pseudohole, right eye: Secondary | ICD-10-CM

## 2019-12-05 DIAGNOSIS — H3581 Retinal edema: Secondary | ICD-10-CM

## 2019-12-05 DIAGNOSIS — I10 Essential (primary) hypertension: Secondary | ICD-10-CM

## 2019-12-05 DIAGNOSIS — Z961 Presence of intraocular lens: Secondary | ICD-10-CM

## 2019-12-05 DIAGNOSIS — H401130 Primary open-angle glaucoma, bilateral, stage unspecified: Secondary | ICD-10-CM

## 2019-12-06 ENCOUNTER — Encounter (INDEPENDENT_AMBULATORY_CARE_PROVIDER_SITE_OTHER): Payer: Self-pay | Admitting: Ophthalmology

## 2019-12-08 NOTE — Progress Notes (Signed)
[facility] Note  12/12/2019     CHIEF COMPLAINT Patient presents for Post-op Follow-up   HISTORY OF PRESENT ILLNESS: Kaitlyn Berry is a 84 y.o. female who presents to the clinic today for:   HPI    Post-op Follow-up    In right eye.  Discomfort includes itching.  Negative for pain, foreign body sensation, tearing, discharge, floaters and none (throbbing).  Vision is blurred at distance and is blurred at near.  I, the attending physician,  performed the HPI with the patient and updated documentation appropriately.          Comments    Patient states OD started itching and throbbing yesterday. Vision still blurred OD. Using gtts and ung as instructed OD. Using timolol once daily left eye only.       Last edited by [name], MD on 12/14/2019  1:03 AM. (History)    pt states she is doing well, she is doing 50 mins of face down time  Referring physician: Gala Romney MD [place], [place] [id number]   HISTORICAL INFORMATION:   Selected notes from the MEDICAL RECORD NUMBER Referred by Dr. [name] for macular hole LEE: 11/17/2019 BCVA OD: 20/200 OS: 20/50 Ocular Hx- possible macular hole, pseudophakia OU PMH- HTN    CURRENT MEDICATIONS: Current Outpatient Medications (Ophthalmic Drugs)  Medication Sig  . atropine 1 % ophthalmic solution Place 1 drop into the right eye in the morning and at bedtime.  . bacitracin-polymyxin b (POLYSPORIN) ophthalmic ointment Place 1 application into the right eye at bedtime. And as needed  . gatifloxacin (ZYMAXID) 0.5 % SOLN Place 1 drop into the right eye.  . prednisoLONE acetate (PRED FORTE) 1 % ophthalmic suspension Place 1 drop into the right eye 4 (four) times daily.  . timolol (TIMOPTIC) 0.5 % ophthalmic solution Place 1 drop into the left eye daily.    No current facility-administered medications for this visit. (Ophthalmic Drugs)   Current Outpatient Medications (Other)   Medication Sig  . acetaminophen (TYLENOL) 650 MG CR tablet Take 1,300 mg by mouth every 8 (eight) hours as needed for pain.  Marland Kitchen amLODipine (NORVASC) 5 MG tablet Take 5 mg by mouth 2 (two) times daily.   . Calcium Citrate-Vitamin D (CITRACAL + D PO) Take 600 mg by mouth daily.  . metoprolol succinate (TOPROL-XL) 100 MG 24 hr tablet Take 100 mg by mouth daily.  . Multiple Vitamins-Minerals (PRESERVISION/LUTEIN) CAPS Take 1 capsule by mouth daily.  Marland Kitchen olmesartan-hydrochlorothiazide (BENICAR HCT) 40-25 MG tablet Take 1 tablet by mouth daily.    No current facility-administered medications for this visit. (Other)      REVIEW OF SYSTEMS: ROS    Positive for: Eyes   Negative for: Constitutional, Gastrointestinal, Neurological, Skin, Genitourinary, Musculoskeletal, HENT, Endocrine, Cardiovascular, Respiratory, Psychiatric, Allergic/Imm, Heme/Lymph   Last edited by Roselee Nova D, COT on 12/12/2019 10:14 AM. (History)       ALLERGIES Allergies  Allergen Reactions  . Sulfa Antibiotics Rash    PAST MEDICAL HISTORY Past Medical History:  Diagnosis Date  . Glaucoma    OU  . Hypertension   . Macular degeneration    OU  . Osteoarthritis of both knees   . Seasonal allergies    Past Surgical History:  Procedure Laterality Date  . [place] VITRECTOMY WITH 20 GAUGE MVR PORT FOR MACULAR HOLE Right 12/04/2019   Procedure: 25 GAUGE PARS PLANA VITRECTOMY WITH 20 GAUGE  MVR PORT FOR MACULAR HOLE;  Surgeon: [name], MD;  Location: [facility];  Service: Ophthalmology;  Laterality: Right;  . CATARACT EXTRACTION Bilateral   . COLONOSCOPY    . EYE SURGERY Bilateral    Cat Sx  . GAS INSERTION Right 12/04/2019   Procedure: INSERTION OF GAS - C3F8;  Surgeon: [name], MD;  Location: [facility];  Service: Ophthalmology;  Laterality: Right;  . GAS/FLUID EXCHANGE Right 12/04/2019   Procedure: GAS/FLUID EXCHANGE;  Surgeon: [name], MD;  Location: [facility];  Service: Ophthalmology;   Laterality: Right;  . MEMBRANE PEEL Right 12/04/2019   Procedure: MEMBRANE PEEL;  Surgeon: [name], MD;  Location: [facility];  Service: Ophthalmology;  Laterality: Right;  . PHOTOCOAGULATION WITH LASER Right 12/04/2019   Procedure: PHOTOCOAGULATION WITH LASER;  Surgeon: [name], MD;  Location: [facility];  Service: Ophthalmology;  Laterality: Right;  . TONSILLECTOMY     age 41 yrs old  . WISDOM TOOTH EXTRACTION      FAMILY HISTORY History reviewed. No pertinent family history.  SOCIAL HISTORY Social History   Tobacco Use  . Smoking status: Never Smoker  . Smokeless tobacco: Never Used  Vaping Use  . Vaping Use: Never used  Substance Use Topics  . Alcohol use: No  . Drug use: No         OPHTHALMIC EXAM:  Base Eye Exam    Visual Acuity (Snellen - Linear)      Right Left   Dist [place] CF at 1' 20/50 -2   Dist ph [place] NI 20/40 -2       Tonometry (Tonopen, 10:23 AM)      Right Left   Pressure 16 16       Pupils      Dark Light Shape React APD   Right 5 5 Round Minimal None   Left 3 2 Round Brisk None       Neuro/Psych    Oriented x3: Yes   Mood/Affect: Normal       Dilation    Right eye: 2.5% Phenylephrine, 1.0% Mydriacyl @ 10:21 AM        Slit Lamp and Fundus Exam    Slit Lamp Exam      Right Left   Lids/Lashes Dermatochalasis - upper lid, Meibomian gland dysfunction Dermatochalasis - upper lid, Meibomian gland dysfunction   Conjunctiva/Sclera Mild Melanosis, mild Subconjunctival hemorrhage inferiorly - improving, sutures intact Mild Melanosis   Cornea arcus, 1+ Punctate epithelial erosions, Debris in tear film arcus, trace Punctate epithelial erosions   Anterior Chamber Deep and quiet Deep and quiet   Iris Round and moderately dilated to 6.73m, Irregular pupil, mild horizontal oval in shape Round and moderately dilated to 6.064m  Lens 3 piece PC IOl, mild temporal displacement, Open posterior capsule 3 piece PC IOl in good position with open PC   Vitreous  post vitrectomy, 95% gas fill Vitreous syneresis       Fundus Exam      Right Left   Disc Pink and Sharp    C/D Ratio 0.7 0.75   Macula Flat under gas with mac hole closing    Vessels Vascular attenuation, Tortuous    Periphery Hazy view, grossly Attached            IMAGING AND PROCEDURES  Imaging and Procedures for 12/12/2019           ASSESSMENT/PLAN:    ICD-10-CM   1. Macular hole of right eye  H35.341  2. Retinal edema  H35.81   3. Essential hypertension  I10   4. Hypertensive retinopathy of both eyes  H35.033   5. Pseudophakia of both eyes  Z96.1   6. Primary open angle glaucoma of both eyes, unspecified glaucoma stage  H40.1130     1,2. Macular hole, right eye   - POW1 s/p PPV/TissueBlue/MP/14% C3F8 OD, 09.16.21             - doing well             - retina attached and good gas bubble in place w/ mac hole closing             - IOP good at 16  - 95% gas fill             - cont   PF 4x/day OD                          zymaxid QID OD -- stop when bottle runs out                         Atropine BID OD -- stop when bottle runs out                         PSO ung QID OD -- decrease to bedtime/PRN             - cont face down positioning 50% of time; avoid laying flat on back              - eye shield when sleeping x1 more week             - post op drop and positioning instructions reviewed  - tylenol/ibuprofen for pain - f/u 3 week, POV  3,4. Hypertensive retinopathy OU - discussed importance of tight BP control - monitor  5. Pseudophakia OU  - s/p CE/IOL [facility])  - IOL in good position, doing well  - monitor  6. POAG  - under the expert management of Dr. [name]  - IOP today: 16 OU  - on timoptic qam OU and latan qhs OU   Ophthalmic Meds Ordered this visit:  Meds ordered this encounter  Medications  . prednisoLONE acetate (PRED FORTE) 1 % ophthalmic suspension    Sig: Place 1 drop into the right eye 4 (four) times daily.     Dispense:  15 mL    Refill:  0  . bacitracin-polymyxin b (POLYSPORIN) ophthalmic ointment    Sig: Place 1 application into the right eye at bedtime. And as needed    Dispense:  3.5 g    Refill:  3       Return in about 3 weeks (around 01/02/2020) for f/u [place] OD, DFE, OCT.  There are no Patient Instructions on file for this visit.   Explained the diagnoses, plan, and follow up with the patient and they expressed understanding.  Patient expressed understanding of the importance of proper follow up care.   This document serves as a record of services personally performed by Gardiner Sleeper, MD, PhD. It was created on their behalf by San Jetty. Owens Shark, OA an ophthalmic technician. The creation of this record is the provider's dictation and/or activities during the visit.    Electronically signed by: San Jetty. [place], [place] 09.20.2021 1:04 AM   Gardiner Sleeper, M.D., Ph.D. Diseases & Surgery  of the Retina and Vitreous [facility]  I have reviewed the above documentation for accuracy and completeness, and I agree with the above. Gardiner Sleeper, M.D., Ph.D. 12/14/19 1:04 AM   Abbreviations: M myopia (nearsighted); A astigmatism; H hyperopia (farsighted); P presbyopia; Mrx spectacle prescription;  CTL contact lenses; OD right eye; OS left eye; OU both eyes  XT exotropia; ET esotropia; PEK punctate epithelial keratitis; PEE punctate epithelial erosions; DES dry eye syndrome; MGD meibomian gland dysfunction; ATs artificial tears; PFAT's preservative free artificial tears; [place] nuclear sclerotic cataract; PSC posterior subcapsular cataract; ERM epi-retinal membrane; PVD posterior vitreous detachment; RD retinal detachment; DM diabetes mellitus; DR diabetic retinopathy; NPDR non-proliferative diabetic retinopathy; PDR proliferative diabetic retinopathy; CSME clinically significant macular edema; DME diabetic macular edema; dbh dot blot hemorrhages; CWS cotton wool spot; POAG  primary open angle glaucoma; C/D cup-to-disc ratio; HVF humphrey visual field; GVF goldmann visual field; OCT optical coherence tomography; IOP intraocular pressure; BRVO Branch retinal vein occlusion; CRVO central retinal vein occlusion; CRAO central retinal artery occlusion; BRAO branch retinal artery occlusion; RT retinal tear; SB scleral buckle; PPV pars plana vitrectomy; VH Vitreous hemorrhage; PRP panretinal laser photocoagulation; IVK intravitreal kenalog; VMT vitreomacular traction; MH Macular hole;  NVD neovascularization of the disc; NVE neovascularization elsewhere; AREDS age related eye disease study; ARMD age related macular degeneration; POAG primary open angle glaucoma; EBMD epithelial/anterior basement membrane dystrophy; ACIOL anterior chamber intraocular lens; IOL intraocular lens; PCIOL posterior chamber intraocular lens; Phaco/IOL phacoemulsification with intraocular lens placement; [place] photorefractive keratectomy; LASIK laser assisted in situ keratomileusis; HTN hypertension; DM diabetes mellitus; COPD chronic obstructive pulmonary disease

## 2019-12-12 ENCOUNTER — Ambulatory Visit (INDEPENDENT_AMBULATORY_CARE_PROVIDER_SITE_OTHER): Payer: Medicare HMO | Admitting: Ophthalmology

## 2019-12-12 ENCOUNTER — Encounter (INDEPENDENT_AMBULATORY_CARE_PROVIDER_SITE_OTHER): Payer: Self-pay | Admitting: Ophthalmology

## 2019-12-12 ENCOUNTER — Other Ambulatory Visit: Payer: Self-pay

## 2019-12-12 DIAGNOSIS — H3581 Retinal edema: Secondary | ICD-10-CM

## 2019-12-12 DIAGNOSIS — I10 Essential (primary) hypertension: Secondary | ICD-10-CM

## 2019-12-12 DIAGNOSIS — H401130 Primary open-angle glaucoma, bilateral, stage unspecified: Secondary | ICD-10-CM

## 2019-12-12 DIAGNOSIS — Z961 Presence of intraocular lens: Secondary | ICD-10-CM

## 2019-12-12 DIAGNOSIS — H35341 Macular cyst, hole, or pseudohole, right eye: Secondary | ICD-10-CM

## 2019-12-12 DIAGNOSIS — H35033 Hypertensive retinopathy, bilateral: Secondary | ICD-10-CM

## 2019-12-12 MED ORDER — PREDNISOLONE ACETATE 1 % OP SUSP: 1.0000 [drp] | Freq: Four times a day (QID) | OPHTHALMIC | 0 refills | Status: AC

## 2019-12-12 MED ORDER — BACITRACIN-POLYMYXIN B 500-10000 UNIT/GM OP OINT: 1.0000 "application " | TOPICAL_OINTMENT | Freq: Every day | OPHTHALMIC | 3 refills | Status: AC

## 2019-12-14 ENCOUNTER — Encounter (INDEPENDENT_AMBULATORY_CARE_PROVIDER_SITE_OTHER): Payer: Self-pay | Admitting: Ophthalmology

## 2019-12-26 ENCOUNTER — Telehealth (INDEPENDENT_AMBULATORY_CARE_PROVIDER_SITE_OTHER): Payer: Self-pay

## 2019-12-30 NOTE — Progress Notes (Signed)
[facility] Note  12/31/2019     CHIEF COMPLAINT Patient presents for Post-op Follow-up   HISTORY OF PRESENT ILLNESS: Kaitlyn Berry is a 84 y.o. female who presents to the clinic today for:   HPI    Post-op Follow-up    In right eye.  Discomfort includes none.  Vision is improved, is blurred at near and is blurred at distance.  I, the attending physician,  performed the HPI with the patient and updated documentation appropriately.          Comments    84 y/o female pt here for 3 wk f/u.  S/p PPV/MP for mac hole repair OD 9.16.21.  Hard to tell if VA OD has improved.  No change in [place] OS.  Denies pain, FOL.  Has a floater in each eye.  PF QID OD, PSO ung QHS OD.  Has only been using Timoptic QAM OS and Latanoprost QHS OS, since she was worried about putting too many gtts in her right eye.       Last edited by [name], MD on 12/31/2019  8:44 PM. (History)    pt states vision is improving, she is still trying to do 30 mins/hr of face down time  Referring physician: Gala Romney MD [address], [place] [id number]   HISTORICAL INFORMATION:   Selected notes from the MEDICAL RECORD NUMBER Referred by Dr. [name] for macular hole LEE: 11/17/2019 BCVA OD: 20/200 OS: 20/50 Ocular Hx- possible macular hole, pseudophakia OU PMH- HTN    CURRENT MEDICATIONS: Current Outpatient Medications (Ophthalmic Drugs)  Medication Sig  . bacitracin-polymyxin b (POLYSPORIN) ophthalmic ointment Place 1 application into the right eye at bedtime. And as needed  . prednisoLONE acetate (PRED FORTE) 1 % ophthalmic suspension Place 1 drop into the right eye 4 (four) times daily.  . timolol (TIMOPTIC) 0.5 % ophthalmic solution Place 1 drop into the left eye daily.   Marland Kitchen atropine 1 % ophthalmic solution Place 1 drop into the right eye in the morning and at bedtime. (Patient not taking: Reported on 12/31/2019)  . gatifloxacin (ZYMAXID) 0.5 % SOLN Place 1  drop into the right eye. (Patient not taking: Reported on 12/31/2019)   No current facility-administered medications for this visit. (Ophthalmic Drugs)   Current Outpatient Medications (Other)  Medication Sig  . acetaminophen (TYLENOL) 650 MG CR tablet Take 1,300 mg by mouth every 8 (eight) hours as needed for pain.  Marland Kitchen amLODipine (NORVASC) 10 MG tablet   . amLODipine (NORVASC) 5 MG tablet Take 5 mg by mouth 2 (two) times daily.   . Calcium Citrate-Vitamin D (CITRACAL + D PO) Take 600 mg by mouth daily.  . metoprolol succinate (TOPROL-XL) 100 MG 24 hr tablet Take 100 mg by mouth daily.  . Multiple Vitamins-Minerals (PRESERVISION/LUTEIN) CAPS Take 1 capsule by mouth daily.  Marland Kitchen olmesartan-hydrochlorothiazide (BENICAR HCT) 40-25 MG tablet Take 1 tablet by mouth daily.   . traZODone (DESYREL) 50 MG tablet    No current facility-administered medications for this visit. (Other)      REVIEW OF SYSTEMS: ROS    Positive for: Eyes   Negative for: Constitutional, Gastrointestinal, Neurological, Skin, Genitourinary, Musculoskeletal, HENT, Endocrine, Cardiovascular, Respiratory, Psychiatric, Allergic/Imm, Heme/Lymph   Last edited by Matthew Folks, COA on 12/31/2019  3:22 PM. (History)       ALLERGIES Allergies  Allergen Reactions  . Sulfa Antibiotics Rash    PAST MEDICAL HISTORY Past Medical History:  Diagnosis Date  . Glaucoma    OU  . Hypertension   . Hypertensive retinopathy    OU  . Macular degeneration    OU  . Osteoarthritis of both knees   . Seasonal allergies    Past Surgical History:  Procedure Laterality Date  . [place] VITRECTOMY WITH 20 GAUGE MVR PORT FOR MACULAR HOLE Right 12/04/2019   Procedure: 25 GAUGE PARS PLANA VITRECTOMY WITH 20 GAUGE MVR PORT FOR MACULAR HOLE;  Surgeon: [name], MD;  Location: [facility];  Service: Ophthalmology;  Laterality: Right;  . CATARACT EXTRACTION Bilateral   . COLONOSCOPY    . EYE SURGERY Bilateral    Cat Sx  . EYE  SURGERY Right 12/04/2019   [facility] - Dr. [name]  . GAS INSERTION Right 12/04/2019   Procedure: INSERTION OF GAS - C3F8;  Surgeon: [name], MD;  Location: [facility];  Service: Ophthalmology;  Laterality: Right;  . GAS/FLUID EXCHANGE Right 12/04/2019   Procedure: GAS/FLUID EXCHANGE;  Surgeon: [name], MD;  Location: [facility];  Service: Ophthalmology;  Laterality: Right;  . MEMBRANE PEEL Right 12/04/2019   Procedure: MEMBRANE PEEL;  Surgeon: [name], MD;  Location: [facility];  Service: Ophthalmology;  Laterality: Right;  . PHOTOCOAGULATION WITH LASER Right 12/04/2019   Procedure: PHOTOCOAGULATION WITH LASER;  Surgeon: [name], MD;  Location: [place] [facility];  Service: Ophthalmology;  Laterality: Right;  . TONSILLECTOMY     age 66 yrs old  . WISDOM TOOTH EXTRACTION      FAMILY HISTORY History reviewed. No pertinent family history.  SOCIAL HISTORY Social History   Tobacco Use  . Smoking status: Never Smoker  . Smokeless tobacco: Never Used  Vaping Use  . Vaping Use: Never used  Substance Use Topics  . Alcohol use: No  . Drug use: No         OPHTHALMIC EXAM:  Base Eye Exam    Visual Acuity (Snellen - Linear)      Right Left   Dist [place] 20/80 -2 20/40 -2   Dist ph [place] NI NI       Tonometry (Tonopen, 3:26 PM)      Right Left   Pressure 16 14       Pupils      Dark Light Shape React APD   Right 3 2 Round Brisk None   Left 3 2 Round Brisk None       Visual Fields (Counting fingers)      Left Right    Full Full       Extraocular Movement      Right Left    Full, Ortho Full, Ortho       Neuro/Psych    Oriented x3: Yes   Mood/Affect: Normal       Dilation    Both eyes: 1.0% Mydriacyl, 2.5% Phenylephrine @ 3:26 PM        Slit Lamp and Fundus Exam    Slit Lamp Exam      Right Left   Lids/Lashes Dermatochalasis - upper lid, Meibomian gland dysfunction Dermatochalasis - upper lid, Meibomian gland dysfunction   Conjunctiva/Sclera Mild Melanosis,  mild Subconjunctival hemorrhage inferiorly - improving, sutures intact Mild Melanosis   Cornea arcus, 1+ Punctate epithelial erosions, ointment arcus, trace Punctate epithelial erosions   Anterior Chamber Deep and quiet Deep and quiet   Iris Round and moderately dilated to 6.68m, Irregular pupil, mild horizontal oval in shape Round and moderately dilated to 6.063m  Lens 3 piece PCIOL, mild temporal displacement, Open posterior capsule 3 piece PC IOl in good position with open PC   Vitreous post vitrectomy, 45% gas fill Vitreous syneresis       Fundus Exam      Right Left   Disc Mild Pallor, Sharp rim, +cupping Sharp rim, central cupping/pallor, temporal PPA, focal PPP   C/D Ratio 0.7 0.75   Macula Flat, mac hole closed, RPE mottling, No heme or edema Flat, Blunted foveal reflex, RPE mottling, clumping and early atrophy centrally   Vessels attenuated, Tortuous Vascular attenuation, Tortuous, mild AV crossing changes   Periphery Hazy view, grossly Attached  Attached, midzonal drusen             IMAGING AND PROCEDURES  Imaging and Procedures for 12/31/2019  OCT, Retina - OU - Both Eyes       Right Eye Quality was good. Central Foveal Thickness: 247. Progression has improved. Findings include normal foveal contour, no IRF (Mac hole closed, focal ellipsoid disruption).   Left Eye Quality was good. Central Foveal Thickness: 190. Progression has been stable. Findings include normal foveal contour, no IRF, no SRF, outer retinal atrophy, subretinal hyper-reflective material (Focal central ellipsoid disruption and ORA -- ?impending macular hole; mild ERM inferiorly).   Notes *Images captured and stored on drive  Diagnosis / Impression:  OD: Mac hole closed, focal central ellipsoid disruption OS: Focal central ellipsoid disruption and ORA -- ?impending macular hole  Clinical management:  See below  Abbreviations: NFP - Normal foveal profile. CME - cystoid macular edema. PED - pigment  epithelial detachment. IRF - intraretinal fluid. SRF - subretinal fluid. EZ - ellipsoid zone. ERM - epiretinal membrane. ORA - outer retinal atrophy. ORT - outer retinal tubulation. SRHM - subretinal hyper-reflective material. IRHM - intraretinal hyper-reflective material                 ASSESSMENT/PLAN:    ICD-10-CM   1. Macular hole of right eye  H35.341   2. Retinal edema  H35.81 OCT, Retina - OU - Both Eyes  3. Essential hypertension  I10   4. Hypertensive retinopathy of both eyes  H35.033   5. Pseudophakia of both eyes  Z96.1   6. Primary open angle glaucoma of both eyes, unspecified glaucoma stage  H40.1130     1,2. Macular hole, right eye   - POW4 s/p PPV/TissueBlue/MP/14% C3F8 OD, 09.16.21             - doing well  - BCVA 20/80 w/ 45% gas bubble             - mac hole closed, retina attached and good gas bubble in place             - IOP good at 16             - cont   PF 4x/day OD                          PSO ung QID OD -- use PRN              - cont face down positioning 50% of time; avoid laying flat on back              - post op drop and positioning instructions reviewed  - f/u 3-4 week, POV  3,4. Hypertensive retinopathy OU - discussed importance of tight BP control - monitor  5. Pseudophakia OU  -  s/p CE/IOL [facility])  - IOL in good position, doing well  - monitor  6. POAG  - under the expert management of Dr. [name]  - IOP today: 16, 14  - on timoptic qam OU and latan qhs OU   Ophthalmic Meds Ordered this visit:  No orders of the defined types were placed in this encounter.      Return for f/u 3-4 weeks, FTMH OD, DFE, OCT.  There are no Patient Instructions on file for this visit.   Explained the diagnoses, plan, and follow up with the patient and they expressed understanding.  Patient expressed understanding of the importance of proper follow up care.   This document serves as a record of services personally performed by Gardiner Sleeper, MD, PhD. It was created on their behalf by Roselee Nova, COMT. The creation of this record is the provider's dictation and/or activities during the visit.  Electronically signed by: Roselee Nova, COMT 12/31/19 8:50 PM  This document serves as a record of services personally performed by Gardiner Sleeper, MD, PhD. It was created on their behalf by San Jetty. Owens Shark, OA an ophthalmic technician. The creation of this record is the provider's dictation and/or activities during the visit.    Electronically signed by: San Jetty. Owens Shark, [place] 10.13.2021 8:50 PM  Gardiner Sleeper, M.D., Ph.D. Diseases & Surgery of the Retina and Vitreous [facility]  I have reviewed the above documentation for accuracy and completeness, and I agree with the above. Gardiner Sleeper, M.D., Ph.D. 12/31/19 8:50 PM   Abbreviations: M myopia (nearsighted); A astigmatism; H hyperopia (farsighted); P presbyopia; Mrx spectacle prescription;  CTL contact lenses; OD right eye; OS left eye; OU both eyes  XT exotropia; ET esotropia; PEK punctate epithelial keratitis; PEE punctate epithelial erosions; DES dry eye syndrome; MGD meibomian gland dysfunction; ATs artificial tears; PFAT's preservative free artificial tears; [place] nuclear sclerotic cataract; PSC posterior subcapsular cataract; ERM epi-retinal membrane; PVD posterior vitreous detachment; RD retinal detachment; DM diabetes mellitus; DR diabetic retinopathy; NPDR non-proliferative diabetic retinopathy; PDR proliferative diabetic retinopathy; CSME clinically significant macular edema; DME diabetic macular edema; dbh dot blot hemorrhages; CWS cotton wool spot; POAG primary open angle glaucoma; C/D cup-to-disc ratio; HVF humphrey visual field; GVF goldmann visual field; OCT optical coherence tomography; IOP intraocular pressure; BRVO Branch retinal vein occlusion; CRVO central retinal vein occlusion; CRAO central retinal artery occlusion; BRAO branch retinal  artery occlusion; RT retinal tear; SB scleral buckle; PPV pars plana vitrectomy; VH Vitreous hemorrhage; PRP panretinal laser photocoagulation; IVK intravitreal kenalog; VMT vitreomacular traction; MH Macular hole;  NVD neovascularization of the disc; NVE neovascularization elsewhere; AREDS age related eye disease study; ARMD age related macular degeneration; POAG primary open angle glaucoma; EBMD epithelial/anterior basement membrane dystrophy; ACIOL anterior chamber intraocular lens; IOL intraocular lens; PCIOL posterior chamber intraocular lens; Phaco/IOL phacoemulsification with intraocular lens placement; Onycha photorefractive keratectomy; LASIK laser assisted in situ keratomileusis; HTN hypertension; DM diabetes mellitus; COPD chronic obstructive pulmonary disease

## 2019-12-31 ENCOUNTER — Other Ambulatory Visit: Payer: Self-pay

## 2019-12-31 ENCOUNTER — Ambulatory Visit (INDEPENDENT_AMBULATORY_CARE_PROVIDER_SITE_OTHER): Payer: Medicare HMO | Admitting: Ophthalmology

## 2019-12-31 ENCOUNTER — Encounter (INDEPENDENT_AMBULATORY_CARE_PROVIDER_SITE_OTHER): Payer: Self-pay | Admitting: Ophthalmology

## 2019-12-31 DIAGNOSIS — H3581 Retinal edema: Secondary | ICD-10-CM | POA: Diagnosis not present

## 2019-12-31 DIAGNOSIS — I10 Essential (primary) hypertension: Secondary | ICD-10-CM

## 2019-12-31 DIAGNOSIS — Z961 Presence of intraocular lens: Secondary | ICD-10-CM

## 2019-12-31 DIAGNOSIS — H35033 Hypertensive retinopathy, bilateral: Secondary | ICD-10-CM

## 2019-12-31 DIAGNOSIS — H401130 Primary open-angle glaucoma, bilateral, stage unspecified: Secondary | ICD-10-CM

## 2019-12-31 DIAGNOSIS — H35341 Macular cyst, hole, or pseudohole, right eye: Secondary | ICD-10-CM

## 2020-01-02 ENCOUNTER — Encounter (INDEPENDENT_AMBULATORY_CARE_PROVIDER_SITE_OTHER): Payer: Medicare HMO | Admitting: Ophthalmology

## 2020-01-07 ENCOUNTER — Encounter (INDEPENDENT_AMBULATORY_CARE_PROVIDER_SITE_OTHER): Payer: Medicare HMO | Admitting: Ophthalmology

## 2020-01-12 ENCOUNTER — Other Ambulatory Visit (INDEPENDENT_AMBULATORY_CARE_PROVIDER_SITE_OTHER): Payer: Self-pay

## 2020-01-12 MED ORDER — BACITRACIN-POLYMYXIN B 500-10000 UNIT/GM OP OINT: TOPICAL_OINTMENT | Freq: Four times a day (QID) | OPHTHALMIC | 0 refills | Status: AC | PRN

## 2020-01-20 NOTE — Progress Notes (Addendum)
[facility] Note  01/21/2020     CHIEF COMPLAINT Patient presents for Retina Follow Up   HISTORY OF PRESENT ILLNESS: Kaitlyn Berry is a 84 y.o. female who presents to the clinic today for:   HPI    Retina Follow Up    Patient presents with  Other.  In right eye.  This started 3 weeks ago.  I, the attending physician,  performed the HPI with the patient and updated documentation appropriately.          Comments    Patient here for 3 weeks retina follow up for s/p PPV mac hole OD. Patient states vision in/out. Its better still a little black bubble. Gets a collection in corner of eyes of discharge. On occasion has sharp pain. Got covid booster and flu shot.       Last edited by [name], MD on 01/25/2020 12:59 AM. (History)    Pt feels like VA continues to gradually improve OD; still sees part of the gas bubble inferiorly OD.  Referring physician: Gala Romney MD [address] [place], [place] [address]   HISTORICAL INFORMATION:   Selected notes from the MEDICAL RECORD NUMBER Referred by Dr. [name] for macular hole LEE: 11/17/2019 BCVA OD: 20/200 OS: 20/50 Ocular Hx- possible macular hole, pseudophakia OU PMH- HTN   CURRENT MEDICATIONS: Current Outpatient Medications (Ophthalmic Drugs)  Medication Sig  . atropine 1 % ophthalmic solution Place 1 drop into the right eye in the morning and at bedtime. (Patient not taking: Reported on 12/31/2019)  . bacitracin-polymyxin b (POLYSPORIN) ophthalmic ointment Place 1 application into the right eye at bedtime. And as needed  . gatifloxacin (ZYMAXID) 0.5 % SOLN Place 1 drop into the right eye. (Patient not taking: Reported on 12/31/2019)  . timolol (TIMOPTIC) 0.5 % ophthalmic solution Place 1 drop into the left eye daily.    No current facility-administered medications for this visit. (Ophthalmic Drugs)   Current Outpatient Medications (Other)  Medication Sig  . acetaminophen (TYLENOL)  650 MG CR tablet Take 1,300 mg by mouth every 8 (eight) hours as needed for pain.  Marland Kitchen amLODipine (NORVASC) 10 MG tablet   . amLODipine (NORVASC) 5 MG tablet Take 5 mg by mouth 2 (two) times daily.   . Calcium Citrate-Vitamin D (CITRACAL + D PO) Take 600 mg by mouth daily.  . metoprolol succinate (TOPROL-XL) 100 MG 24 hr tablet Take 100 mg by mouth daily.  . Multiple Vitamins-Minerals (PRESERVISION/LUTEIN) CAPS Take 1 capsule by mouth daily.  Marland Kitchen olmesartan-hydrochlorothiazide (BENICAR HCT) 40-25 MG tablet Take 1 tablet by mouth daily.   . traZODone (DESYREL) 50 MG tablet    No current facility-administered medications for this visit. (Other)      REVIEW OF SYSTEMS: ROS    Positive for: Eyes   Negative for: Constitutional, Gastrointestinal, Neurological, Skin, Genitourinary, Musculoskeletal, HENT, Endocrine, Cardiovascular, Respiratory, Psychiatric, Allergic/Imm, Heme/Lymph   Last edited by Theodore Demark, COA on 01/21/2020  3:00 PM. (History)       ALLERGIES Allergies  Allergen Reactions  . Sulfa Antibiotics Rash    PAST MEDICAL HISTORY Past Medical History:  Diagnosis Date  . Glaucoma    OU  . Hypertension   . Hypertensive retinopathy    OU  . Macular degeneration    OU  . Osteoarthritis of both knees   . Seasonal allergies    Past Surgical History:  Procedure Laterality Date  . [place]  VITRECTOMY WITH 20 GAUGE MVR PORT FOR MACULAR HOLE Right 12/04/2019   Procedure: 25 GAUGE PARS PLANA VITRECTOMY WITH 20 GAUGE MVR PORT FOR MACULAR HOLE;  Surgeon: [name], MD;  Location: [facility];  Service: Ophthalmology;  Laterality: Right;  . CATARACT EXTRACTION Bilateral   . COLONOSCOPY    . EYE SURGERY Bilateral    Cat Sx  . EYE SURGERY Right 12/04/2019   [facility] Repair - Dr. [name]  . GAS INSERTION Right 12/04/2019   Procedure: INSERTION OF GAS - C3F8;  Surgeon: [name], MD;  Location: [facility];  Service: Ophthalmology;  Laterality: Right;  . GAS/FLUID  EXCHANGE Right 12/04/2019   Procedure: GAS/FLUID EXCHANGE;  Surgeon: [name], MD;  Location: [facility];  Service: Ophthalmology;  Laterality: Right;  . MEMBRANE PEEL Right 12/04/2019   Procedure: MEMBRANE PEEL;  Surgeon: [name], MD;  Location: [facility];  Service: Ophthalmology;  Laterality: Right;  . PHOTOCOAGULATION WITH LASER Right 12/04/2019   Procedure: PHOTOCOAGULATION WITH LASER;  Surgeon: [name], MD;  Location: [facility];  Service: Ophthalmology;  Laterality: Right;  . TONSILLECTOMY     age 61 yrs old  . WISDOM TOOTH EXTRACTION      FAMILY HISTORY History reviewed. No pertinent family history.  SOCIAL HISTORY Social History   Tobacco Use  . Smoking status: Never Smoker  . Smokeless tobacco: Never Used  Vaping Use  . Vaping Use: Never used  Substance Use Topics  . Alcohol use: No  . Drug use: No         OPHTHALMIC EXAM:  Base Eye Exam    Visual Acuity (Snellen - Linear)      Right Left   Dist [place] 20/100 +2 20/50 -2   Dist ph Impact 20/50 -1 20/40 -2       Tonometry (Tonopen, 2:56 PM)      Right Left   Pressure 12 13       Pupils      Dark Light Shape React APD   Right 3 2 Round Brisk None   Left 3 2 Round Brisk None       Visual Fields (Counting fingers)      Left Right    Full Full       Extraocular Movement      Right Left    Full, Ortho Full, Ortho       Neuro/Psych    Oriented x3: Yes   Mood/Affect: Normal       Dilation    Both eyes: 1.0% Mydriacyl, 2.5% Phenylephrine @ 2:56 PM        Slit Lamp and Fundus Exam    Slit Lamp Exam      Right Left   Lids/Lashes Dermatochalasis - upper lid, Meibomian gland dysfunction Dermatochalasis - upper lid, Meibomian gland dysfunction   Conjunctiva/Sclera Mild Melanosis, mild Subconjunctival hemorrhage inferiorly - improving, sutures intact Mild Melanosis   Cornea arcus, 1+ Punctate epithelial erosions arcus, trace Punctate epithelial erosions   Anterior Chamber Deep and quiet Deep and quiet    Iris Round and moderately dilated to 6.76m, Irregular pupil, mild horizontal oval in shape Round and moderately dilated to 6.016m  Lens 3 piece PCIOL, mild temporal displacement, Open posterior capsule 3 piece PC IOl in good position with open PC   Vitreous post vitrectomy, 1% gas bubble Vitreous syneresis       Fundus Exam      Right Left   Disc Mild Pallor, Sharp rim, +cupping  Very thin inferior rim, central cupping/pallor, temporal PPA, focal PPP   C/D Ratio 0.7 0.75   Macula Flat, mac hole closed, RPE mottling, No heme or edema Flat, Blunted foveal reflex, RPE mottling, clumping and early atrophy centrally, drusen   Vessels attenuated, Tortuous Vascular attenuation, Tortuous, mild AV crossing changes   Periphery Attached, mild peripheral drusen Attached, midzonal drusen, no heme           Refraction    Wearing Rx      Sphere Cylinder Axis Add   Right -1.50 +1.00 175 +2.50   Left -1.25 +1.25 005 +2.50   Type: PAL          IMAGING AND PROCEDURES  Imaging and Procedures for 01/21/2020  OCT, Retina - OU - Both Eyes       Right Eye Quality was good. Central Foveal Thickness: 236. Progression has improved. Findings include normal foveal contour, no IRF (Mac hole closed, focal cemtral ellipsoid disruption--improving).   Left Eye Quality was good. Central Foveal Thickness: 187. Progression has been stable. Findings include normal foveal contour, no IRF, no SRF, outer retinal atrophy, subretinal hyper-reflective material (Focal central ellipsoid disruption and ORA -- ?impending macular hole; mild ERM inferiorly--stable from prior).   Notes *Images captured and stored on drive  Diagnosis / Impression:  OD: Mac hole closed, focal central ellipsoid disruption--improving OS: Focal central ellipsoid disruption and ORA -- ?impending macular hole--stable from prior  Clinical management:  See below  Abbreviations: NFP - Normal foveal profile. CME - cystoid macular edema. PED -  pigment epithelial detachment. IRF - intraretinal fluid. SRF - subretinal fluid. EZ - ellipsoid zone. ERM - epiretinal membrane. ORA - outer retinal atrophy. ORT - outer retinal tubulation. SRHM - subretinal hyper-reflective material. IRHM - intraretinal hyper-reflective material                 ASSESSMENT/PLAN:    ICD-10-CM   1. Macular hole of right eye  H35.341   2. Retinal edema  H35.81 OCT, Retina - OU - Both Eyes  3. Essential hypertension  I10   4. Hypertensive retinopathy of both eyes  H35.033   5. Pseudophakia of both eyes  Z96.1   6. Primary open angle glaucoma of both eyes, unspecified glaucoma stage  H40.1130     1,2. Macular hole, right eye   - s/p PPV/TissueBlue/MP/14% C3F8 OD, 09.16.21             - doing well  - BCVA 20/50 w/1% gas bubble             - mac hole closed, retina attached  - gas bubble down to 1%             - IOP good at 12             - Start PF taper--TID x 1 wk, BID x 2 wks, QD x 1 wk then stop             - avoid laying flat on back              - post op drop and positioning instructions reviewed  - f/u 6 weeks - POV  3,4. Hypertensive retinopathy OU - discussed importance of tight BP control - monitor  5. Pseudophakia OU  - s/p CE/IOL [facility])  - IOL in good position, doing well  - monitor  6. POAG  - under the expert management of Dr. [name]  - IOP today: 12,  13  - on timoptic qam OU and latanoprost qhs OU  Ophthalmic Meds Ordered this visit:  No orders of the defined types were placed in this encounter.      Return in about 6 weeks (around 03/03/2020) for 6 wk POV w/DFE&OCT.  There are no Patient Instructions on file for this visit.   Explained the diagnoses, plan, and follow up with the patient and they expressed understanding.  Patient expressed understanding of the importance of proper follow up care.   This document serves as a record of services personally performed by Gardiner Sleeper, MD, PhD. It was  created on their behalf by Roselee Nova, COMT. The creation of this record is the provider's dictation and/or activities during the visit.  Electronically signed by: Roselee Nova, COMT 01/25/20 10:01 AM  This document serves as a record of services personally performed by Gardiner Sleeper, MD, PhD. It was created on their behalf by Estill Bakes, COT an ophthalmic technician. The creation of this record is the provider's dictation and/or activities during the visit.    Electronically signed by: Estill Bakes, COT 11.3.21 @ 10:01 AM  Gardiner Sleeper, M.D., Ph.D. Diseases & Surgery of the Retina and Vitreous [facility]  I have reviewed the above documentation for accuracy and completeness, and I agree with the above. Gardiner Sleeper, M.D., Ph.D. 01/25/20 10:01 AM   Abbreviations: M myopia (nearsighted); A astigmatism; H hyperopia (farsighted); P presbyopia; Mrx spectacle prescription;  CTL contact lenses; OD right eye; OS left eye; OU both eyes  XT exotropia; ET esotropia; PEK punctate epithelial keratitis; PEE punctate epithelial erosions; DES dry eye syndrome; MGD meibomian gland dysfunction; ATs artificial tears; PFAT's preservative free artificial tears; [place] nuclear sclerotic cataract; PSC posterior subcapsular cataract; ERM epi-retinal membrane; PVD posterior vitreous detachment; RD retinal detachment; DM diabetes mellitus; DR diabetic retinopathy; NPDR non-proliferative diabetic retinopathy; PDR proliferative diabetic retinopathy; CSME clinically significant macular edema; DME diabetic macular edema; dbh dot blot hemorrhages; CWS cotton wool spot; POAG primary open angle glaucoma; C/D cup-to-disc ratio; HVF humphrey visual field; GVF goldmann visual field; OCT optical coherence tomography; IOP intraocular pressure; BRVO Branch retinal vein occlusion; CRVO central retinal vein occlusion; CRAO central retinal artery occlusion; BRAO branch retinal artery occlusion; RT retinal  tear; SB scleral buckle; PPV pars plana vitrectomy; VH Vitreous hemorrhage; PRP panretinal laser photocoagulation; IVK intravitreal kenalog; VMT vitreomacular traction; MH Macular hole;  NVD neovascularization of the disc; NVE neovascularization elsewhere; AREDS age related eye disease study; ARMD age related macular degeneration; POAG primary open angle glaucoma; EBMD epithelial/anterior basement membrane dystrophy; ACIOL anterior chamber intraocular lens; IOL intraocular lens; PCIOL posterior chamber intraocular lens; Phaco/IOL phacoemulsification with intraocular lens placement; [place] photorefractive keratectomy; LASIK laser assisted in situ keratomileusis; HTN hypertension; DM diabetes mellitus; COPD chronic obstructive pulmonary disease

## 2020-01-21 ENCOUNTER — Other Ambulatory Visit: Payer: Self-pay

## 2020-01-21 ENCOUNTER — Encounter (INDEPENDENT_AMBULATORY_CARE_PROVIDER_SITE_OTHER): Payer: Self-pay | Admitting: Ophthalmology

## 2020-01-21 ENCOUNTER — Ambulatory Visit (INDEPENDENT_AMBULATORY_CARE_PROVIDER_SITE_OTHER): Payer: Medicare HMO | Admitting: Ophthalmology

## 2020-01-21 DIAGNOSIS — H401130 Primary open-angle glaucoma, bilateral, stage unspecified: Secondary | ICD-10-CM

## 2020-01-21 DIAGNOSIS — H3581 Retinal edema: Secondary | ICD-10-CM

## 2020-01-21 DIAGNOSIS — H35033 Hypertensive retinopathy, bilateral: Secondary | ICD-10-CM

## 2020-01-21 DIAGNOSIS — Z961 Presence of intraocular lens: Secondary | ICD-10-CM

## 2020-01-21 DIAGNOSIS — I10 Essential (primary) hypertension: Secondary | ICD-10-CM

## 2020-01-21 DIAGNOSIS — H35341 Macular cyst, hole, or pseudohole, right eye: Secondary | ICD-10-CM

## 2020-01-25 ENCOUNTER — Encounter (INDEPENDENT_AMBULATORY_CARE_PROVIDER_SITE_OTHER): Payer: Self-pay | Admitting: Ophthalmology

## 2020-02-10 ENCOUNTER — Ambulatory Visit: Payer: Self-pay | Admitting: Podiatry

## 2020-02-26 NOTE — Progress Notes (Signed)
[facility] Note  03/03/2020     CHIEF COMPLAINT Patient presents for Post-op Follow-up   HISTORY OF PRESENT ILLNESS: Kaitlyn Berry is a 84 y.o. female who presents to the clinic today for:   HPI    Post-op Follow-up    In right eye.  Discomfort includes none.  Negative for pain, itching, foreign body sensation, tearing, discharge and floaters.  Vision is improved.  I, the attending physician,  performed the HPI with the patient and updated documentation appropriately.          Comments    Pt is here for POV for [place] OD, pt states vision is much better, she has taper PF down to once a day, she think she may have seen a flash temporally OD, she did not use glaucoma drops this morning, denies eye pain       Last edited by [name], MD on 03/07/2020  1:18 AM. (History)    Pt feels   Referring physician: Gala Romney MD [place], [place] [address]   HISTORICAL INFORMATION:   Selected notes from the MEDICAL RECORD NUMBER Referred by Dr. [name] for macular hole LEE: 11/17/2019 BCVA OD: 20/200 OS: 20/50 Ocular Hx- possible macular hole, pseudophakia OU PMH- HTN   CURRENT MEDICATIONS: Current Outpatient Medications (Ophthalmic Drugs)  Medication Sig  . atropine 1 % ophthalmic solution Place 1 drop into the right eye in the morning and at bedtime. (Patient not taking: Reported on 12/31/2019)  . bacitracin-polymyxin b (POLYSPORIN) ophthalmic ointment Place 1 application into the right eye at bedtime. And as needed  . gatifloxacin (ZYMAXID) 0.5 % SOLN Place 1 drop into the right eye. (Patient not taking: Reported on 12/31/2019)  . timolol (TIMOPTIC) 0.5 % ophthalmic solution Place 1 drop into the left eye daily.    No current facility-administered medications for this visit. (Ophthalmic Drugs)   Current Outpatient Medications (Other)  Medication Sig  . acetaminophen (TYLENOL) 650 MG CR tablet Take 1,300 mg by mouth  every 8 (eight) hours as needed for pain.  Marland Kitchen amLODipine (NORVASC) 10 MG tablet   . amLODipine (NORVASC) 5 MG tablet Take 5 mg by mouth 2 (two) times daily.   Marland Kitchen buPROPion (WELLBUTRIN XL) 150 MG 24 hr tablet   . Calcium Citrate-Vitamin D (CITRACAL + D PO) Take 600 mg by mouth daily.  Marland Kitchen lamoTRIgine (LAMICTAL) 100 MG tablet   . metoprolol succinate (TOPROL-XL) 100 MG 24 hr tablet Take 100 mg by mouth daily.  . Multiple Vitamins-Minerals (PRESERVISION/LUTEIN) CAPS Take 1 capsule by mouth daily.  Marland Kitchen olmesartan-hydrochlorothiazide (BENICAR HCT) 40-25 MG tablet Take 1 tablet by mouth daily.   Marland Kitchen oxybutynin (DITROPAN) 5 MG tablet   . traZODone (DESYREL) 50 MG tablet    No current facility-administered medications for this visit. (Other)      REVIEW OF SYSTEMS: ROS    Positive for: Cardiovascular, Eyes   Negative for: Constitutional, Gastrointestinal, Neurological, Skin, Genitourinary, Musculoskeletal, HENT, Endocrine, Respiratory, Psychiatric, Allergic/Imm, Heme/Lymph   Last edited by Debbrah Alar, COT on 03/03/2020  8:03 AM. (History)       ALLERGIES Allergies  Allergen Reactions  . Sulfa Antibiotics Rash    PAST MEDICAL HISTORY Past Medical History:  Diagnosis Date  . Glaucoma    OU  . Hypertension   . Hypertensive retinopathy    OU  . Macular degeneration    OU  . Osteoarthritis of both knees   .  Seasonal allergies    Past Surgical History:  Procedure Laterality Date  . [place] VITRECTOMY WITH 20 GAUGE MVR PORT FOR MACULAR HOLE Right 12/04/2019   Procedure: 25 GAUGE PARS PLANA VITRECTOMY WITH 20 GAUGE MVR PORT FOR MACULAR HOLE;  Surgeon: [name], MD;  Location: [place];  Service: Ophthalmology;  Laterality: Right;  . CATARACT EXTRACTION Bilateral   . COLONOSCOPY    . EYE SURGERY Bilateral    Cat Sx  . EYE SURGERY Right 12/04/2019   [facility] Repair - Dr. [name]  . GAS INSERTION Right 12/04/2019   Procedure: INSERTION OF GAS - C3F8;  Surgeon:  [name], MD;  Location: [place];  Service: Ophthalmology;  Laterality: Right;  . GAS/FLUID EXCHANGE Right 12/04/2019   Procedure: GAS/FLUID EXCHANGE;  Surgeon: [name], MD;  Location: [place];  Service: Ophthalmology;  Laterality: Right;  . MEMBRANE PEEL Right 12/04/2019   Procedure: MEMBRANE PEEL;  Surgeon: [name], MD;  Location: [place];  Service: Ophthalmology;  Laterality: Right;  . PHOTOCOAGULATION WITH LASER Right 12/04/2019   Procedure: PHOTOCOAGULATION WITH LASER;  Surgeon: [name], MD;  Location: [place];  Service: Ophthalmology;  Laterality: Right;  . TONSILLECTOMY     age 31 yrs old  . WISDOM TOOTH EXTRACTION      FAMILY HISTORY History reviewed. No pertinent family history.  SOCIAL HISTORY Social History   Tobacco Use  . Smoking status: Never Smoker  . Smokeless tobacco: Never Used  Vaping Use  . Vaping Use: Never used  Substance Use Topics  . Alcohol use: No  . Drug use: No         OPHTHALMIC EXAM:  Base Eye Exam    Visual Acuity (Snellen - Linear)      Right Left   Dist [place] 20/150 +1 20/60 -1   Dist ph [facility] 20/40        Tonometry (Tonopen, 8:13 AM)      Right Left   Pressure 16 15       Pupils      Dark Light Shape React APD   Right 3 2 Round Slow None   Left 3 2 Round Slow None       Visual Fields (Counting fingers)      Left Right    Full Full       Extraocular Movement      Right Left    Full, Ortho Full, Ortho       Neuro/Psych    Oriented x3: Yes   Mood/Affect: Normal       Dilation    Both eyes: 1.0% Mydriacyl, 2.5% Phenylephrine @ 8:13 AM        Slit Lamp and Fundus Exam    Slit Lamp Exam      Right Left   Lids/Lashes Dermatochalasis - upper lid, Meibomian gland dysfunction Dermatochalasis - upper lid, Meibomian gland dysfunction   Conjunctiva/Sclera Mild Melanosis Mild Melanosis   Cornea arcus, 1+ Punctate epithelial erosions, Debris in tear film arcus, 1-2+ Punctate epithelial erosions   Anterior Chamber  Deep and quiet Deep and quiet   Iris Round and moderately dilated to 6.57m, Irregular pupil, mild horizontal oval in shape Round and moderately dilated to 6.065m  Lens 3 piece PCIOL, mild temporal displacement, Open posterior capsule 3 piece PC IOl in good position with open PC   Vitreous post vitrectomy, gas bubble gone Vitreous syneresis       Fundus Exam  Right Left   Disc Mild Pallor, Sharp rim, +cupping Very thin inferior rim, central cupping/pallor, temporal PPA, focal PPP   C/D Ratio 0.7 0.75   Macula Flat, mac hole closed, RPE mottling, No heme or edema Flat, Blunted foveal reflex, RPE mottling, clumping and early atrophy centrally, drusen   Vessels attenuated, Tortuous Vascular attenuation, Tortuous   Periphery Attached, mild peripheral drusen Attached, midzonal drusen, no heme             IMAGING AND PROCEDURES  Imaging and Procedures for 03/03/2020  OCT, Retina - OU - Both Eyes       Right Eye Quality was good. Central Foveal Thickness: 230. Progression has been stable. Findings include normal foveal contour, no IRF, no SRF (Mac hole closed, focal central ellipsoid disruption -- persistent).   Left Eye Quality was good. Central Foveal Thickness: 194. Progression has been stable. Findings include normal foveal contour, no IRF, no SRF, outer retinal atrophy, subretinal hyper-reflective material (Focal central ellipsoid disruption and ORA -- ?impending macular hole; mild ERM inferiorly -- stable from prior).   Notes *Images captured and stored on drive  Diagnosis / Impression:  OD: Mac hole closed, focal central ellipsoid disruption -- persistent OS: Focal central ellipsoid disruption and ORA -- ?impending macular hole--stable from prior  Clinical management:  See below  Abbreviations: NFP - Normal foveal profile. CME - cystoid macular edema. PED - pigment epithelial detachment. IRF - intraretinal fluid. SRF - subretinal fluid. EZ - ellipsoid zone. ERM -  epiretinal membrane. ORA - outer retinal atrophy. ORT - outer retinal tubulation. SRHM - subretinal hyper-reflective material. IRHM - intraretinal hyper-reflective material                 ASSESSMENT/PLAN:    ICD-10-CM   1. Macular hole of right eye  H35.341   2. Retinal edema  H35.81 OCT, Retina - OU - Both Eyes  3. Essential hypertension  I10   4. Hypertensive retinopathy of both eyes  H35.033   5. Pseudophakia of both eyes  Z96.1   6. Primary open angle glaucoma of both eyes, unspecified glaucoma stage  H40.1130     1,2. Macular hole, right eye   - s/p PPV/TissueBlue/MP/14% C3F8 OD, 09.16.21             - doing well  - BCVA 20/40 -- gas bubble now gone             - mac hole closed, retina attached             - IOP good at 16             - completed PF taper  - pt is clear from retina standpoint for refraction and new glasses - f/u 3-4 months. DFE, OCT  3,4. Hypertensive retinopathy OU - discussed importance of tight BP control - monitor  5. Pseudophakia OU  - s/p CE/IOL [facility])  - IOL in good position, doing well  - monitor  6. POAG  - under the expert management of Dr. [name]  - IOP today: 16,15  - on timoptic qam OU and latanoprost qhs OU -- pt states she is only using timoptic per Dr. [name]   Ophthalmic Meds Ordered this visit:  No orders of the defined types were placed in this encounter.      Return for f/u 3-4 months, FTMH OD, DFE, OCT.  There are no Patient Instructions on file for this visit.   Explained the diagnoses,  plan, and follow up with the patient and they expressed understanding.  Patient expressed understanding of the importance of proper follow up care.   This document serves as a record of services personally performed by Gardiner Sleeper, MD, PhD. It was created on their behalf by Roselee Nova, COMT. The creation of this record is the provider's dictation and/or activities during the visit.  Electronically signed by:  Roselee Nova, COMT 03/07/20 1:24 AM   This document serves as a record of services personally performed by Gardiner Sleeper, MD, PhD. It was created on their behalf by San Jetty. Owens Shark, OA an ophthalmic technician. The creation of this record is the provider's dictation and/or activities during the visit.    Electronically signed by: San Jetty. Owens Shark, [place] 12.15.2021 1:24 AM  Gardiner Sleeper, M.D., Ph.D. Diseases & Surgery of the Retina and Vitreous [facility]  I have reviewed the above documentation for accuracy and completeness, and I agree with the above. Gardiner Sleeper, M.D., Ph.D. 03/07/20 1:24 AM   Abbreviations: M myopia (nearsighted); A astigmatism; H hyperopia (farsighted); P presbyopia; Mrx spectacle prescription;  CTL contact lenses; OD right eye; OS left eye; OU both eyes  XT exotropia; ET esotropia; PEK punctate epithelial keratitis; PEE punctate epithelial erosions; DES dry eye syndrome; MGD meibomian gland dysfunction; ATs artificial tears; PFAT's preservative free artificial tears; [place] nuclear sclerotic cataract; PSC posterior subcapsular cataract; ERM epi-retinal membrane; PVD posterior vitreous detachment; RD retinal detachment; DM diabetes mellitus; DR diabetic retinopathy; NPDR non-proliferative diabetic retinopathy; PDR proliferative diabetic retinopathy; CSME clinically significant macular edema; DME diabetic macular edema; dbh dot blot hemorrhages; CWS cotton wool spot; POAG primary open angle glaucoma; C/D cup-to-disc ratio; HVF humphrey visual field; GVF goldmann visual field; OCT optical coherence tomography; IOP intraocular pressure; BRVO Branch retinal vein occlusion; CRVO central retinal vein occlusion; CRAO central retinal artery occlusion; BRAO branch retinal artery occlusion; RT retinal tear; SB scleral buckle; PPV pars plana vitrectomy; VH Vitreous hemorrhage; PRP panretinal laser photocoagulation; IVK intravitreal kenalog; VMT vitreomacular  traction; MH Macular hole;  NVD neovascularization of the disc; NVE neovascularization elsewhere; AREDS age related eye disease study; ARMD age related macular degeneration; POAG primary open angle glaucoma; EBMD epithelial/anterior basement membrane dystrophy; ACIOL anterior chamber intraocular lens; IOL intraocular lens; PCIOL posterior chamber intraocular lens; Phaco/IOL phacoemulsification with intraocular lens placement; [place] photorefractive keratectomy; LASIK laser assisted in situ keratomileusis; HTN hypertension; DM diabetes mellitus; COPD chronic obstructive pulmonary disease

## 2020-03-03 ENCOUNTER — Encounter (INDEPENDENT_AMBULATORY_CARE_PROVIDER_SITE_OTHER): Payer: Medicare HMO | Admitting: Ophthalmology

## 2020-03-03 ENCOUNTER — Ambulatory Visit (INDEPENDENT_AMBULATORY_CARE_PROVIDER_SITE_OTHER): Payer: Medicare HMO | Admitting: Ophthalmology

## 2020-03-03 ENCOUNTER — Encounter (INDEPENDENT_AMBULATORY_CARE_PROVIDER_SITE_OTHER): Payer: Self-pay | Admitting: Ophthalmology

## 2020-03-03 ENCOUNTER — Other Ambulatory Visit: Payer: Self-pay

## 2020-03-03 DIAGNOSIS — H35341 Macular cyst, hole, or pseudohole, right eye: Secondary | ICD-10-CM

## 2020-03-03 DIAGNOSIS — H401130 Primary open-angle glaucoma, bilateral, stage unspecified: Secondary | ICD-10-CM

## 2020-03-03 DIAGNOSIS — H3581 Retinal edema: Secondary | ICD-10-CM | POA: Diagnosis not present

## 2020-03-03 DIAGNOSIS — Z961 Presence of intraocular lens: Secondary | ICD-10-CM

## 2020-03-03 DIAGNOSIS — I10 Essential (primary) hypertension: Secondary | ICD-10-CM

## 2020-03-03 DIAGNOSIS — H35033 Hypertensive retinopathy, bilateral: Secondary | ICD-10-CM

## 2020-03-07 ENCOUNTER — Encounter (INDEPENDENT_AMBULATORY_CARE_PROVIDER_SITE_OTHER): Payer: Self-pay | Admitting: Ophthalmology

## 2020-06-07 NOTE — Progress Notes (Shared)
[facility] Note  06/09/2020     CHIEF COMPLAINT Patient presents for No chief complaint on file.   HISTORY OF PRESENT ILLNESS: Kaitlyn Berry is a 85 y.o. female who presents to the clinic today for:   Pt feels   Referring physician: Gala Romney MD [address] [place], [place] [address]   HISTORICAL INFORMATION:   Selected notes from the MEDICAL RECORD NUMBER Referred by Dr. [name] for macular hole LEE: 11/17/2019 BCVA OD: 20/200 OS: 20/50 Ocular Hx- possible macular hole, pseudophakia OU PMH- HTN   CURRENT MEDICATIONS: Current Outpatient Medications (Ophthalmic Drugs)  Medication Sig  . atropine 1 % ophthalmic solution Place 1 drop into the right eye in the morning and at bedtime. (Patient not taking: Reported on 12/31/2019)  . bacitracin-polymyxin b (POLYSPORIN) ophthalmic ointment Place 1 application into the right eye at bedtime. And as needed  . gatifloxacin (ZYMAXID) 0.5 % SOLN Place 1 drop into the right eye. (Patient not taking: Reported on 12/31/2019)  . timolol (TIMOPTIC) 0.5 % ophthalmic solution Place 1 drop into the left eye daily.    No current facility-administered medications for this visit. (Ophthalmic Drugs)   Current Outpatient Medications (Other)  Medication Sig  . acetaminophen (TYLENOL) 650 MG CR tablet Take 1,300 mg by mouth every 8 (eight) hours as needed for pain.  Marland Kitchen amLODipine (NORVASC) 10 MG tablet   . amLODipine (NORVASC) 5 MG tablet Take 5 mg by mouth 2 (two) times daily.   Marland Kitchen buPROPion (WELLBUTRIN XL) 150 MG 24 hr tablet   . Calcium Citrate-Vitamin D (CITRACAL + D PO) Take 600 mg by mouth daily.  Marland Kitchen lamoTRIgine (LAMICTAL) 100 MG tablet   . metoprolol succinate (TOPROL-XL) 100 MG 24 hr tablet Take 100 mg by mouth daily.  . Multiple Vitamins-Minerals (PRESERVISION/LUTEIN) CAPS Take 1 capsule by mouth daily.  Marland Kitchen olmesartan-hydrochlorothiazide (BENICAR HCT) 40-25 MG tablet Take 1 tablet by mouth daily.    Marland Kitchen oxybutynin (DITROPAN) 5 MG tablet   . traZODone (DESYREL) 50 MG tablet    No current facility-administered medications for this visit. (Other)      REVIEW OF SYSTEMS:    ALLERGIES Allergies  Allergen Reactions  . Sulfa Antibiotics Rash    PAST MEDICAL HISTORY Past Medical History:  Diagnosis Date  . Glaucoma    OU  . Hypertension   . Hypertensive retinopathy    OU  . Macular degeneration    OU  . Osteoarthritis of both knees   . Seasonal allergies    Past Surgical History:  Procedure Laterality Date  . [place] VITRECTOMY WITH 20 GAUGE MVR PORT FOR MACULAR HOLE Right 12/04/2019   Procedure: 25 GAUGE PARS PLANA VITRECTOMY WITH 20 GAUGE MVR PORT FOR MACULAR HOLE;  Surgeon: [name], MD;  Location: [place];  Service: Ophthalmology;  Laterality: Right;  . CATARACT EXTRACTION Bilateral   . COLONOSCOPY    . EYE SURGERY Bilateral    Cat Sx  . EYE SURGERY Right 12/04/2019   [facility] Repair - Dr. [name]  . GAS INSERTION Right 12/04/2019   Procedure: INSERTION OF GAS - C3F8;  Surgeon: [name], MD;  Location: [place];  Service: Ophthalmology;  Laterality: Right;  . GAS/FLUID EXCHANGE Right 12/04/2019   Procedure: GAS/FLUID EXCHANGE;  Surgeon: [name], MD;  Location: [place];  Service: Ophthalmology;  Laterality: Right;  . MEMBRANE PEEL Right 12/04/2019   Procedure: MEMBRANE PEEL;  Surgeon: Coralyn Pear,  Aaron Edelman, MD;  Location: [place];  Service: Ophthalmology;  Laterality: Right;  . PHOTOCOAGULATION WITH LASER Right 12/04/2019   Procedure: PHOTOCOAGULATION WITH LASER;  Surgeon: [name], MD;  Location: [place];  Service: Ophthalmology;  Laterality: Right;  . TONSILLECTOMY     age 85 yrs old  . WISDOM TOOTH EXTRACTION      FAMILY HISTORY No family history on file.  SOCIAL HISTORY Social History   Tobacco Use  . Smoking status: Never Smoker  . Smokeless tobacco: Never Used  Vaping Use  . Vaping Use: Never used  Substance Use Topics  . Alcohol  use: No  . Drug use: No         OPHTHALMIC EXAM:  Not recorded     IMAGING AND PROCEDURES  Imaging and Procedures for 06/09/2020           ASSESSMENT/PLAN:    ICD-10-CM   1. Macular hole of right eye  H35.341   2. Retinal edema  H35.81   3. Essential hypertension  I10   4. Hypertensive retinopathy of both eyes  H35.033   5. Pseudophakia of both eyes  Z96.1   6. Primary open angle glaucoma of both eyes, unspecified glaucoma stage  H40.1130     1,2. Macular hole, right eye   - s/p PPV/TissueBlue/MP/14% C3F8 OD, 09.16.21             - doing well  - BCVA 20/40 -- gas bubble now gone             - mac hole closed, retina attached             - IOP good at 16             - completed PF taper  - pt is clear from retina standpoint for refraction and new glasses - f/u 3-4 months. DFE, OCT  3,4. Hypertensive retinopathy OU - discussed importance of tight BP control - monitor  5. Pseudophakia OU  - s/p CE/IOL [facility])  - IOL in good position, doing well  - monitor  6. POAG  - under the expert management of Dr. [name]  - IOP today: 16,15  - on timoptic qam OU and latanoprost qhs OU--only using timoptic qam OU per Dr. [name]  Ophthalmic Meds Ordered this visit:  No orders of the defined types were placed in this encounter.      No follow-ups on file.  There are no Patient Instructions on file for this visit.   Explained the diagnoses, plan, and follow up with the patient and they expressed understanding.  Patient expressed understanding of the importance of proper follow up care.   This document serves as a record of services personally performed by Gardiner Sleeper, MD, PhD. It was created on their behalf by Roselee Nova, COMT. The creation of this record is the provider's dictation and/or activities during the visit.  Electronically signed by: Roselee Nova, COMT 06/07/20 10:53 AM    Gardiner Sleeper, M.D., Ph.D. Diseases & Surgery of the  Retina and Vitreous [facility]: M myopia (nearsighted); A astigmatism; H hyperopia (farsighted); P presbyopia; Mrx spectacle prescription;  CTL contact lenses; OD right eye; OS left eye; OU both eyes  XT exotropia; ET esotropia; PEK punctate epithelial keratitis; PEE punctate epithelial erosions; DES dry eye syndrome; MGD meibomian gland dysfunction; ATs artificial tears; PFAT's preservative free artificial tears; [place] nuclear sclerotic cataract; PSC posterior subcapsular cataract;  ERM epi-retinal membrane; PVD posterior vitreous detachment; RD retinal detachment; DM diabetes mellitus; DR diabetic retinopathy; NPDR non-proliferative diabetic retinopathy; PDR proliferative diabetic retinopathy; CSME clinically significant macular edema; DME diabetic macular edema; dbh dot blot hemorrhages; CWS cotton wool spot; POAG primary open angle glaucoma; C/D cup-to-disc ratio; HVF humphrey visual field; GVF goldmann visual field; OCT optical coherence tomography; IOP intraocular pressure; BRVO Branch retinal vein occlusion; CRVO central retinal vein occlusion; CRAO central retinal artery occlusion; BRAO branch retinal artery occlusion; RT retinal tear; SB scleral buckle; PPV pars plana vitrectomy; VH Vitreous hemorrhage; PRP panretinal laser photocoagulation; IVK intravitreal kenalog; VMT vitreomacular traction; MH Macular hole;  NVD neovascularization of the disc; NVE neovascularization elsewhere; AREDS age related eye disease study; ARMD age related macular degeneration; POAG primary open angle glaucoma; EBMD epithelial/anterior basement membrane dystrophy; ACIOL anterior chamber intraocular lens; IOL intraocular lens; PCIOL posterior chamber intraocular lens; Phaco/IOL phacoemulsification with intraocular lens placement; [place] photorefractive keratectomy; LASIK laser assisted in situ keratomileusis; HTN hypertension; DM diabetes mellitus; COPD chronic obstructive pulmonary  disease

## 2020-06-09 ENCOUNTER — Encounter (INDEPENDENT_AMBULATORY_CARE_PROVIDER_SITE_OTHER): Payer: Medicare HMO | Admitting: Ophthalmology

## 2020-07-28 ENCOUNTER — Encounter (INDEPENDENT_AMBULATORY_CARE_PROVIDER_SITE_OTHER): Payer: Medicare HMO | Admitting: Ophthalmology

## 2020-08-20 ENCOUNTER — Encounter (INDEPENDENT_AMBULATORY_CARE_PROVIDER_SITE_OTHER): Payer: Medicare HMO | Admitting: Ophthalmology

## 2020-09-02 NOTE — Progress Notes (Signed)
[facility] Note  09/06/2020     CHIEF COMPLAINT Patient presents for Retina Follow Up   HISTORY OF PRESENT ILLNESS: Kaitlyn Berry is a 85 y.o. female who presents to the clinic today for:   HPI     Retina Follow Up   Patient presents with  Other.  In right eye.  Duration of 6 months.  Since onset it is stable.  I, the attending physician,  performed the HPI with the patient and updated documentation appropriately.        Comments   6 month follow up hx FTMH repair OD-  She seen Dr. Monica Martinez since last visit.  He said everything is looking good.  For about a month she has been seeing "white floaters" OD that comes and goes.  Denies FOLs.  Patient would like to know if it is okay for her to take Trazodone to help her sleep.  The label says not to take if you have glaucoma states pt.       Last edited by [name], MD on 09/06/2020  4:32 PM.    Pt states vision is doing well, she states Dr. [name] is happy with how her vision is doing now, pt states she has a lot of joint aches and was rx Celebrex, but doesn't know if she wants to take it  Referring physician: [name] [place], [place] [address]   HISTORICAL INFORMATION:   Selected notes from the MEDICAL RECORD NUMBER Referred by Dr. [name] for macular hole LEE: 11/17/2019 BCVA OD: 20/200 OS: 20/50 Ocular Hx- possible macular hole, pseudophakia OU PMH- HTN   CURRENT MEDICATIONS: Current Outpatient Medications (Ophthalmic Drugs)  Medication Sig   timolol (TIMOPTIC) 0.5 % ophthalmic solution Place 1 drop into the left eye daily.    atropine 1 % ophthalmic solution Place 1 drop into the right eye in the morning and at bedtime. (Patient not taking: No sig reported)   bacitracin-polymyxin b (POLYSPORIN) ophthalmic ointment Place 1 application into the right eye at bedtime. And as needed (Patient not taking: Reported on 09/06/2020)   gatifloxacin (ZYMAXID) 0.5 % SOLN  Place 1 drop into the right eye. (Patient not taking: No sig reported)   No current facility-administered medications for this visit. (Ophthalmic Drugs)   Current Outpatient Medications (Other)  Medication Sig   acetaminophen (TYLENOL) 650 MG CR tablet Take 1,300 mg by mouth every 8 (eight) hours as needed for pain.   amLODipine (NORVASC) 10 MG tablet    aspirin EC 81 MG tablet Take 81 mg by mouth daily. Swallow whole.   celecoxib (CELEBREX) 100 MG capsule Take 100 mg by mouth 2 (two) times daily.   lamoTRIgine (LAMICTAL) 100 MG tablet    metoprolol succinate (TOPROL-XL) 100 MG 24 hr tablet Take 100 mg by mouth daily.   Multiple Vitamins-Minerals (PRESERVISION/LUTEIN) CAPS Take 1 capsule by mouth daily.   olmesartan-hydrochlorothiazide (BENICAR HCT) 40-25 MG tablet Take 1 tablet by mouth daily.    oxybutynin (DITROPAN) 5 MG tablet    potassium chloride (MICRO-K) 10 MEQ CR capsule Take 10 mEq by mouth daily.   traZODone (DESYREL) 50 MG tablet    ALPRAZolam (XANAX) 0.25 MG tablet Take 0.25 mg by mouth at bedtime as needed for anxiety. (Patient not taking: Reported on 09/06/2020)   amLODipine (NORVASC) 5 MG tablet Take 5 mg by mouth 2 (two) times daily.  (Patient not taking: Reported on 09/06/2020)  buPROPion (WELLBUTRIN XL) 150 MG 24 hr tablet  (Patient not taking: Reported on 09/06/2020)   Calcium Citrate-Vitamin D (CITRACAL + D PO) Take 600 mg by mouth daily. (Patient not taking: Reported on 09/06/2020)   No current facility-administered medications for this visit. (Other)      REVIEW OF SYSTEMS: ROS   Positive for: Musculoskeletal, Cardiovascular, Eyes Negative for: Constitutional, Gastrointestinal, Neurological, Skin, Genitourinary, HENT, Endocrine, Respiratory, Psychiatric, Allergic/Imm, Heme/Lymph Last edited by Leonie Douglas, COA on 09/06/2020  2:57 PM.        ALLERGIES Allergies  Allergen Reactions   Sulfa Antibiotics Rash    PAST MEDICAL HISTORY Past Medical History:   Diagnosis Date   Glaucoma    OU   Hypertension    Hypertensive retinopathy    OU   Macular degeneration    OU   Osteoarthritis of both knees    Seasonal allergies    Past Surgical History:  Procedure Laterality Date   25 GAUGE PARS PLANA VITRECTOMY WITH 20 GAUGE MVR PORT FOR MACULAR HOLE Right 12/04/2019   Procedure: 25 GAUGE PARS PLANA VITRECTOMY WITH 20 GAUGE MVR PORT FOR MACULAR HOLE;  Surgeon: [name], MD;  Location: [place];  Service: Ophthalmology;  Laterality: Right;   CATARACT EXTRACTION Bilateral    COLONOSCOPY     EYE SURGERY Bilateral    Cat Sx   EYE SURGERY Right 12/04/2019   [facility] Repair - Dr. [name]   GAS INSERTION Right 12/04/2019   Procedure: INSERTION OF GAS - C3F8;  Surgeon: [name], MD;  Location: [place];  Service: Ophthalmology;  Laterality: Right;   GAS/FLUID EXCHANGE Right 12/04/2019   Procedure: GAS/FLUID EXCHANGE;  Surgeon: [name], MD;  Location: [place];  Service: Ophthalmology;  Laterality: Right;   MEMBRANE PEEL Right 12/04/2019   Procedure: MEMBRANE PEEL;  Surgeon: [name], MD;  Location: [place];  Service: Ophthalmology;  Laterality: Right;   PHOTOCOAGULATION WITH LASER Right 12/04/2019   Procedure: PHOTOCOAGULATION WITH LASER;  Surgeon: [name], MD;  Location: [facility];  Service: Ophthalmology;  Laterality: Right;   TONSILLECTOMY     age 63 yrs old   75 TOOTH EXTRACTION      FAMILY HISTORY History reviewed. No pertinent family history.  SOCIAL HISTORY Social History   Tobacco Use   Smoking status: Never   Smokeless tobacco: Never  Vaping Use   Vaping Use: Never used  Substance Use Topics   Alcohol use: No   Drug use: No         OPHTHALMIC EXAM:  Base Eye Exam     Visual Acuity (Snellen - Linear)       Right Left   Dist [place] 20/40 +2 20/50 -2   Dist ph [place] NI NI         Tonometry (Tonopen, 3:09 PM)       Right Left   Pressure 17 14         Pupils       Dark Light Shape React APD    Right 2 1.5 Round Slow None   Left 2 1.5 Round Slow None         Visual Fields (Counting fingers)       Left Right    Full Full         Extraocular Movement       Right Left    Full Full         Neuro/Psych     Oriented x3: Yes  Mood/Affect: Normal         Dilation     Both eyes: 1.0% Mydriacyl, 2.5% Phenylephrine @ 3:09 PM           Slit Lamp and Fundus Exam     Slit Lamp Exam       Right Left   Lids/Lashes Dermatochalasis - upper lid, Meibomian gland dysfunction Dermatochalasis - upper lid, Meibomian gland dysfunction   Conjunctiva/Sclera Mild Melanosis Mild Melanosis   Cornea arcus, 1+ Punctate epithelial erosions, Debris in tear film, fine endo pigment arcus, 1-2+ Punctate epithelial erosions   Anterior Chamber Deep and quiet Deep and quiet   Iris Round and moderately dilated to 6.49m, Irregular pupil, mild horizontal oval in shape Round and moderately dilated to 6.054m  Lens 3 piece PCIOL, mild temporal displacement, Open posterior capsule 3 piece PC IOl in good position with open PC   Vitreous post vitrectomy Vitreous syneresis         Fundus Exam       Right Left   Disc Mild Pallor, Sharp rim, +cupping, +PPP Very thin inferior rim, central cupping/pallor, temporal PPA, focal PPP   C/D Ratio 0.7 0.8   Macula Flat, mac hole closed, RPE mottling, No heme or edema Flat, Blunted foveal reflex, RPE mottling, clumping and early atrophy centrally, drusen   Vessels attenuated, Tortuous Vascular attenuation, Tortuous   Periphery Attached, mild peripheral drusen, peripheral laser scars Attached, midzonal and peripheral drusen, no heme               IMAGING AND PROCEDURES  Imaging and Procedures for 09/06/2020  OCT, Retina - OU - Both Eyes       Right Eye Quality was good. Central Foveal Thickness: 263. Progression has improved. Findings include normal foveal contour, no IRF, no SRF (Mac hole closed, interval improvement in central ellipsoid  disruption).   Left Eye Quality was good. Central Foveal Thickness: 193. Progression has been stable. Findings include normal foveal contour, no IRF, no SRF, outer retinal atrophy, subretinal hyper-reflective material (Focal central ellipsoid disruption and ORA -- stable from prior, mild ERM inferiorly -- stable from prior).   Notes *Images captured and stored on drive  Diagnosis / Impression:  OD: Mac hole closed, interval improvement in central ellipsoid disruption OS: Focal central ellipsoid disruption and ORA -- stable from prior  Clinical management:  See below  Abbreviations: NFP - Normal foveal profile. CME - cystoid macular edema. PED - pigment epithelial detachment. IRF - intraretinal fluid. SRF - subretinal fluid. EZ - ellipsoid zone. ERM - epiretinal membrane. ORA - outer retinal atrophy. ORT - outer retinal tubulation. SRHM - subretinal hyper-reflective material. IRHM - intraretinal hyper-reflective material               ASSESSMENT/PLAN:    ICD-10-CM   1. Macular hole of right eye  H35.341     2. Retinal edema  H35.81 OCT, Retina - OU - Both Eyes    3. Essential hypertension  I10     4. Hypertensive retinopathy of both eyes  H35.033     5. Pseudophakia of both eyes  Z96.1     6. Primary open angle glaucoma of both eyes, unspecified glaucoma stage  H40.1130       1,2. Macular hole, right eye   - s/p PPV/TissueBlue/MP/14% C3F8 OD, 09.16.21             - doing well  - BCVA 20/40              -  mac hole closed, retina attached             - IOP good at 17  - pt is clear from retina standpoint for refraction and new glasses - f/u 6 months. DFE, OCT  3,4. Hypertensive retinopathy OU - discussed importance of tight BP control - monitor  5. Pseudophakia OU  - s/p CE/IOL OU ([place])  - IOLs in good position, doing well  - monitor  6. POAG  - under the expert management of Dr. [name]  - IOP today: 17,14  - on timoptic qam OU and  latanoprost qhs OU -- pt states she is only using timoptic per Dr. [name]   Ophthalmic Meds Ordered this visit:  No orders of the defined types were placed in this encounter.      Return in about 6 months (around 03/08/2021) for f/u macular hole OD, DFE, OCT.  There are no Patient Instructions on file for this visit.  This document serves as a record of services personally performed by Gardiner Sleeper, MD, PhD. It was created on their behalf by Leeann Must, [place], an ophthalmic technician. The creation of this record is the provider's dictation and/or activities during the visit.    Electronically signed by: Leeann Must, COA _0 @ 4:59 PM  This document serves as a record of services personally performed by Gardiner Sleeper, MD, PhD. It was created on their behalf by San Jetty. Owens Shark, OA an ophthalmic technician. The creation of this record is the provider's dictation and/or activities during the visit.    Electronically signed by: San Jetty. Owens Shark, [place] 06.20.2022 4:59 PM  Gardiner Sleeper, M.D., Ph.D. Diseases & Surgery of the Retina and [place] 09/06/2020   I have reviewed the above documentation for accuracy and completeness, and I agree with the above. Gardiner Sleeper, M.D., Ph.D. 09/06/20 4:59 PM   Abbreviations: M myopia (nearsighted); A astigmatism; H hyperopia (farsighted); P presbyopia; Mrx spectacle prescription;  CTL contact lenses; OD right eye; OS left eye; OU both eyes  XT exotropia; ET esotropia; PEK punctate epithelial keratitis; PEE punctate epithelial erosions; DES dry eye syndrome; MGD meibomian gland dysfunction; ATs artificial tears; PFAT's preservative free artificial tears; [place] nuclear sclerotic cataract; PSC posterior subcapsular cataract; ERM epi-retinal membrane; PVD posterior vitreous detachment; RD retinal detachment; DM diabetes mellitus; DR diabetic retinopathy; NPDR non-proliferative diabetic retinopathy; PDR  proliferative diabetic retinopathy; CSME clinically significant macular edema; DME diabetic macular edema; dbh dot blot hemorrhages; CWS cotton wool spot; POAG primary open angle glaucoma; C/D cup-to-disc ratio; HVF humphrey visual field; GVF goldmann visual field; OCT optical coherence tomography; IOP intraocular pressure; BRVO Branch retinal vein occlusion; CRVO central retinal vein occlusion; CRAO central retinal artery occlusion; BRAO branch retinal artery occlusion; RT retinal tear; SB scleral buckle; PPV pars plana vitrectomy; VH Vitreous hemorrhage; PRP panretinal laser photocoagulation; IVK intravitreal kenalog; VMT vitreomacular traction; MH Macular hole;  NVD neovascularization of the disc; NVE neovascularization elsewhere; AREDS age related eye disease study; ARMD age related macular degeneration; POAG primary open angle glaucoma; EBMD epithelial/anterior basement membrane dystrophy; ACIOL anterior chamber intraocular lens; IOL intraocular lens; PCIOL posterior chamber intraocular lens; Phaco/IOL phacoemulsification with intraocular lens placement; [place] photorefractive keratectomy; LASIK laser assisted in situ keratomileusis; HTN hypertension; DM diabetes mellitus; COPD chronic obstructive pulmonary disease

## 2020-09-06 ENCOUNTER — Other Ambulatory Visit: Payer: Self-pay

## 2020-09-06 ENCOUNTER — Encounter (INDEPENDENT_AMBULATORY_CARE_PROVIDER_SITE_OTHER): Payer: Self-pay | Admitting: Ophthalmology

## 2020-09-06 ENCOUNTER — Ambulatory Visit (INDEPENDENT_AMBULATORY_CARE_PROVIDER_SITE_OTHER): Payer: Medicare HMO | Admitting: Ophthalmology

## 2020-09-06 DIAGNOSIS — H3581 Retinal edema: Secondary | ICD-10-CM

## 2020-09-06 DIAGNOSIS — I10 Essential (primary) hypertension: Secondary | ICD-10-CM

## 2020-09-06 DIAGNOSIS — H35033 Hypertensive retinopathy, bilateral: Secondary | ICD-10-CM

## 2020-09-06 DIAGNOSIS — Z961 Presence of intraocular lens: Secondary | ICD-10-CM

## 2020-09-06 DIAGNOSIS — H401130 Primary open-angle glaucoma, bilateral, stage unspecified: Secondary | ICD-10-CM

## 2020-09-06 DIAGNOSIS — H35341 Macular cyst, hole, or pseudohole, right eye: Secondary | ICD-10-CM

## 2020-12-07 ENCOUNTER — Ambulatory Visit: Payer: Medicare HMO | Admitting: Podiatry

## 2021-02-25 NOTE — Progress Notes (Signed)
[facility] Note  03/08/2021     CHIEF COMPLAINT Patient presents for Retina Follow Up    HISTORY OF PRESENT ILLNESS: Kaitlyn Berry is a 85 y.o. female who presents to the clinic today for:   HPI     Retina Follow Up   Patient presents with  Other.  In right eye.  This started 6 months ago.  I, the attending physician,  performed the HPI with the patient and updated documentation appropriately.        Comments   Patient here for 6 months retina follow up for mac hole OD. Patient states vision normal vision is fine. Has a new RX for glasses. OS has a discharge. Saw  Dr Monica Martinez.      Last edited by [name], MD on 03/08/2021  4:27 PM.    Pt states her eye feels good   Referring physician: Gala Romney MD [address] [place], [place] [address]   HISTORICAL INFORMATION:   Selected notes from the MEDICAL RECORD NUMBER Referred by Dr. [name] for macular hole LEE: 11/17/2019 BCVA OD: 20/200 OS: 20/50 Ocular Hx- possible macular hole, pseudophakia OU PMH- HTN   CURRENT MEDICATIONS: Current Outpatient Medications (Ophthalmic Drugs)  Medication Sig   timolol (TIMOPTIC) 0.5 % ophthalmic solution Place 1 drop into the left eye daily.    atropine 1 % ophthalmic solution Place 1 drop into the right eye in the morning and at bedtime. (Patient not taking: Reported on 12/31/2019)   bacitracin-polymyxin b (POLYSPORIN) ophthalmic ointment Place 1 application into the right eye at bedtime. And as needed (Patient not taking: Reported on 09/06/2020)   gatifloxacin (ZYMAXID) 0.5 % SOLN Place 1 drop into the right eye. (Patient not taking: Reported on 12/31/2019)   No current facility-administered medications for this visit. (Ophthalmic Drugs)   Current Outpatient Medications (Other)  Medication Sig   acetaminophen (TYLENOL) 650 MG CR tablet Take 1,300 mg by mouth every 8 (eight) hours as needed for pain.   amLODipine (NORVASC) 10 MG tablet     aspirin EC 81 MG tablet Take 81 mg by mouth daily. Swallow whole.   celecoxib (CELEBREX) 100 MG capsule Take 100 mg by mouth 2 (two) times daily.   lamoTRIgine (LAMICTAL) 100 MG tablet    metoprolol succinate (TOPROL-XL) 100 MG 24 hr tablet Take 100 mg by mouth daily.   Multiple Vitamins-Minerals (PRESERVISION/LUTEIN) CAPS Take 1 capsule by mouth daily.   olmesartan-hydrochlorothiazide (BENICAR HCT) 40-25 MG tablet Take 1 tablet by mouth daily.    oxybutynin (DITROPAN) 5 MG tablet    potassium chloride (MICRO-K) 10 MEQ CR capsule Take 10 mEq by mouth daily.   traZODone (DESYREL) 50 MG tablet    ALPRAZolam (XANAX) 0.25 MG tablet Take 0.25 mg by mouth at bedtime as needed for anxiety. (Patient not taking: Reported on 09/06/2020)   amLODipine (NORVASC) 5 MG tablet Take 5 mg by mouth 2 (two) times daily.  (Patient not taking: Reported on 09/06/2020)   buPROPion (WELLBUTRIN XL) 150 MG 24 hr tablet  (Patient not taking: Reported on 09/06/2020)   Calcium Citrate-Vitamin D (CITRACAL + D PO) Take 600 mg by mouth daily. (Patient not taking: Reported on 09/06/2020)   No current facility-administered medications for this visit. (Other)   REVIEW OF SYSTEMS: ROS   Positive for: Musculoskeletal, Cardiovascular, Eyes Negative for: Constitutional, Gastrointestinal, Neurological, Skin, Genitourinary, HENT, Endocrine, Respiratory, Psychiatric, Allergic/Imm, Heme/Lymph Last edited by Theodore Demark, COA on  03/08/2021  2:56 PM.     ALLERGIES Allergies  Allergen Reactions   Sulfa Antibiotics Rash   PAST MEDICAL HISTORY Past Medical History:  Diagnosis Date   Glaucoma    OU   Hypertension    Hypertensive retinopathy    OU   Macular degeneration    OU   Osteoarthritis of both knees    Seasonal allergies    Past Surgical History:  Procedure Laterality Date   25 GAUGE PARS PLANA VITRECTOMY WITH 20 GAUGE MVR PORT FOR MACULAR HOLE Right 12/04/2019   Procedure: 25 GAUGE PARS PLANA VITRECTOMY WITH 20  GAUGE MVR PORT FOR MACULAR HOLE;  Surgeon: [name], MD;  Location: [facility];  Service: Ophthalmology;  Laterality: Right;   CATARACT EXTRACTION Bilateral    COLONOSCOPY     EYE SURGERY Bilateral    Cat Sx   EYE SURGERY Right 12/04/2019   [facility] Rep[facility] - Dr. [name]   GAS INSERTION Right 12/04/2019   Procedure: INSERTION OF GAS - C3F8;  Surgeon: [name], MD;  Location: [place];  Service: Ophthalmology;  Laterality: Right;   GAS/FLUID EXCHANGE Right 12/04/2019   Procedure: GAS/FLUID EXCHANGE;  Surgeon: [name], MD;  Location: [place];  Service: Ophthalmology;  Laterality: Right;   MEMBRANE PEEL Right 12/04/2019   Procedure: MEMBRANE PEEL;  Surgeon: [name], MD;  Location: [place];  Service: Ophthalmology;  Laterality: Right;   PHOTOCOAGULATION WITH LASER Right 12/04/2019   Procedure: PHOTOCOAGULATION WITH LASER;  Surgeon: [name], MD;  Location: [facility];  Service: Ophthalmology;  Laterality: Right;   TONSILLECTOMY     age 85 yrs old   2 TOOTH EXTRACTION     FAMILY HISTORY History reviewed. No pertinent family history.  SOCIAL HISTORY Social History   Tobacco Use   Smoking status: Never   Smokeless tobacco: Never  Vaping Use   Vaping Use: Never used  Substance Use Topics   Alcohol use: No   Drug use: No       OPHTHALMIC EXAM:  Base Eye Exam     Visual Acuity (Snellen - Linear)       Right Left   Dist [place] 20/30 -2 20/60 +2   Dist ph  NI NI         Tonometry (Tonopen, 2:53 PM)       Right Left   Pressure 06 06         Pupils       Dark Light Shape React APD   Right 2 1.5 Round Slow None   Left 2 1.5 Round Slow None         Visual Fields (Counting fingers)       Left Right    Full Full         Extraocular Movement       Right Left    Full, Ortho Full, Ortho         Neuro/Psych     Oriented x3: Yes   Mood/Affect: Normal         Dilation     Both eyes: 1.0% Mydriacyl, 2.5% Phenylephrine @ 2:53 PM            Slit Lamp and Fundus Exam     Slit Lamp Exam       Right Left   Lids/Lashes Dermatochalasis - upper lid, Meibomian gland dysfunction Dermatochalasis - upper lid, Meibomian gland dysfunction   Conjunctiva/Sclera Mild Melanosis Mild Melanosis   Cornea arcus, 1+ Punctate epithelial  erosions, Debris in tear film, fine endo pigment arcus, 1+ Punctate epithelial erosions, tear film debris   Anterior Chamber Deep and quiet Deep and quiet   Iris Round and moderately dilated to 6.62m, Irregular pupil, mild horizontal oval in shape Round and moderately dilated to 6.050m  Lens 3 piece PCIOL, mild temporal displacement, Open posterior capsule 3 piece PC IOl in good position with open PC   Anterior Vitreous post vitrectomy; clear Vitreous syneresis         Fundus Exam       Right Left   Disc Mild Pallor, Sharp rim, +cupping, +PPP Very thin inferior rim, central cupping/pallor, temporal PPA, focal PPP   C/D Ratio 0.75 0.85   Macula Flat, mac hole closed, RPE mottling, No heme or edema Flat, Blunted foveal reflex, RPE mottling, clumping and early atrophy centrally, drusen   Vessels attenuated, Tortuous Vascular attenuation, Tortuous   Periphery Attached, mild peripheral drusen, peripheral laser scars, No heme Attached, midzonal and peripheral drusen, no heme           Refraction     Wearing Rx       Sphere Cylinder Axis Add   Right -1.50 +1.00 175 +2.50   Left -1.25 +1.25 005 +2.50    Type: PAL            IMAGING AND PROCEDURES  Imaging and Procedures for 03/08/2021  OCT, Retina - OU - Both Eyes       Right Eye Quality was good. Central Foveal Thickness: 234. Progression has improved. Findings include normal foveal contour, no IRF, no SRF (Mac hole closed, interval improvement in central ellipsoid signal).   Left Eye Quality was good. Central Foveal Thickness: 187. Progression has been stable. Findings include normal foveal contour, no IRF, no SRF, outer retinal  atrophy, subretinal hyper-reflective material (Focal central ellipsoid disruption with focal SRHM within and ORA -- stable from prior, mild ERM inferiorly -- stable from prior).   Notes *Images captured and stored on drive  Diagnosis / Impression:  OD: Mac hole closed, interval improvement in central ellipsoid signal OS: Focal central ellipsoid disruption and ORA -- stable from prior  Clinical management:  See below  Abbreviations: NFP - Normal foveal profile. CME - cystoid macular edema. PED - pigment epithelial detachment. IRF - intraretinal fluid. SRF - subretinal fluid. EZ - ellipsoid zone. ERM - epiretinal membrane. ORA - outer retinal atrophy. ORT - outer retinal tubulation. SRHM - subretinal hyper-reflective material. IRHM - intraretinal hyper-reflective material            ASSESSMENT/PLAN:    ICD-10-CM   1. Macular hole of right eye  H35.341 OCT, Retina - OU - Both Eyes    2. Essential hypertension  I10     3. Hypertensive retinopathy of both eyes  H35.033     4. Pseudophakia of both eyes  Z96.1     5. Primary open angle glaucoma of both eyes, unspecified glaucoma stage  H40.1130      1. Macular hole, right eye   - s/p PPV/TissueBlue/MP/14% C3F8 OD, 09.16.21             - doing well  - BCVA improved to 20/30 from 20/40             - mac hole closed, retina attached  - OCT shows interval improvement in central ellipsoid signal             - IOP good at 06  - pt  is clear from retina standpoint for refraction and new glasses - f/u 9 months. DFE, OCT  2,3. Hypertensive retinopathy OU - discussed importance of tight BP control - monitor  4. Pseudophakia OU  - s/p CE/IOL OU ([facility])  - IOLs in good position, doing well  - monitor  5. POAG  - under the expert management of Dr. [name]  - IOP today: 06 OU  - on timoptic qam OU and latanoprost qhs OU -- pt states she is only using timoptic per Dr. [name]   Ophthalmic Meds Ordered this visit:  No  orders of the defined types were placed in this encounter.    Return in about 9 months (around 12/07/2021) for f/u mac hole OD, DFE, OCT.  There are no Patient Instructions on file for this visit.  This document serves as a record of services personally performed by Gardiner Sleeper, MD, PhD. It was created on their behalf by Estill Bakes, COT an ophthalmic technician. The creation of this record is the provider's dictation and/or activities during the visit.    Electronically signed by: Estill Bakes, COT 12.9.22 @ 4:30 PM   This document serves as a record of services personally performed by Gardiner Sleeper, MD, PhD. It was created on their behalf by San Jetty. Owens Shark, OA an ophthalmic technician. The creation of this record is the provider's dictation and/or activities during the visit.    Electronically signed by: San Jetty. Owens Shark, OA _0 @ 4:30 PM   Gardiner Sleeper, M.D., Ph.D. Diseases & Surgery of the Retina and Vitreous [facility]  I have reviewed the above documentation for accuracy and completeness, and I agree with the above. Gardiner Sleeper, M.D., Ph.D. 03/08/21 4:30 PM  Abbreviations: M myopia (nearsighted); A astigmatism; H hyperopia (farsighted); P presbyopia; Mrx spectacle prescription;  CTL contact lenses; OD right eye; OS left eye; OU both eyes  XT exotropia; ET esotropia; PEK punctate epithelial keratitis; PEE punctate epithelial erosions; DES dry eye syndrome; MGD meibomian gland dysfunction; ATs artificial tears; PFAT's preservative free artificial tears; [place] nuclear sclerotic cataract; PSC posterior subcapsular cataract; ERM epi-retinal membrane; PVD posterior vitreous detachment; RD retinal detachment; DM diabetes mellitus; DR diabetic retinopathy; NPDR non-proliferative diabetic retinopathy; PDR proliferative diabetic retinopathy; CSME clinically significant macular edema; DME diabetic macular edema; dbh dot blot hemorrhages; CWS cotton wool spot;  POAG primary open angle glaucoma; C/D cup-to-disc ratio; HVF humphrey visual field; GVF goldmann visual field; OCT optical coherence tomography; IOP intraocular pressure; BRVO Branch retinal vein occlusion; CRVO central retinal vein occlusion; CRAO central retinal artery occlusion; BRAO branch retinal artery occlusion; RT retinal tear; SB scleral buckle; PPV pars plana vitrectomy; VH Vitreous hemorrhage; PRP panretinal laser photocoagulation; IVK intravitreal kenalog; VMT vitreomacular traction; MH Macular hole;  NVD neovascularization of the disc; NVE neovascularization elsewhere; AREDS age related eye disease study; ARMD age related macular degeneration; POAG primary open angle glaucoma; EBMD epithelial/anterior basement membrane dystrophy; ACIOL anterior chamber intraocular lens; IOL intraocular lens; PCIOL posterior chamber intraocular lens; Phaco/IOL phacoemulsification with intraocular lens placement; [place] photorefractive keratectomy; LASIK laser assisted in situ keratomileusis; HTN hypertension; DM diabetes mellitus; COPD chronic obstructive pulmonary disease

## 2021-03-03 ENCOUNTER — Ambulatory Visit: Payer: Medicare HMO | Admitting: Podiatry

## 2021-03-04 ENCOUNTER — Encounter: Payer: Self-pay | Admitting: Podiatry

## 2021-03-04 ENCOUNTER — Ambulatory Visit (INDEPENDENT_AMBULATORY_CARE_PROVIDER_SITE_OTHER): Payer: Medicare HMO | Admitting: Podiatry

## 2021-03-04 ENCOUNTER — Other Ambulatory Visit: Payer: Self-pay

## 2021-03-04 DIAGNOSIS — B351 Tinea unguium: Secondary | ICD-10-CM | POA: Diagnosis not present

## 2021-03-04 DIAGNOSIS — M79676 Pain in unspecified toe(s): Secondary | ICD-10-CM

## 2021-03-04 DIAGNOSIS — B353 Tinea pedis: Secondary | ICD-10-CM | POA: Diagnosis not present

## 2021-03-04 NOTE — Progress Notes (Signed)
This patient returns to the office for evaluation and treatment of long thick painful nails .  This patient is unable to trim her own nails since the patient cannot reach her feet.  Patient says the nails are painful walking and wearing her shoes. She returns for preventive foot care services. Relates she also has been having itching and scaling on her feet and was given a prescription anti-fungal. Wanted it to be evaluated.   General Appearance  Alert, conversant and in no acute stress.  Vascular  Dorsalis pedis and posterior tibial  pulses are palpable  bilaterally.  Capillary return is within normal limits  bilaterally. Temperature is within normal limits  bilaterally.  Neurologic  Senn-Weinstein monofilament wire test within normal limits  bilaterally. Muscle power within normal limits bilaterally.  Nails Thick disfigured discolored nails with subungual debris  from hallux to fifth toes bilaterally. No evidence of bacterial infection or drainage bilaterally.  Orthopedic  No limitations of motion  feet .  No crepitus or effusions noted.  HAV  B/L.  Hammer toes  B/L.  Skin  normotropic skin with no porokeratosis noted bilaterally.  No signs of infections or ulcers noted.   Scaling noted to plantar foot.   Onychomycosis  Pain in toes right foot  Pain in toes left foot  Debridement  of nails  1-5  B/L with a nail nipper.  Nails were then filed using a dremel tool with no incidents. Continue with anti-fungal cream.     RTC 10 weeks.  Louann Sjogren, DPM

## 2021-03-08 ENCOUNTER — Other Ambulatory Visit: Payer: Self-pay

## 2021-03-08 ENCOUNTER — Ambulatory Visit (INDEPENDENT_AMBULATORY_CARE_PROVIDER_SITE_OTHER): Payer: Medicare HMO | Admitting: Ophthalmology

## 2021-03-08 ENCOUNTER — Encounter (INDEPENDENT_AMBULATORY_CARE_PROVIDER_SITE_OTHER): Payer: Self-pay | Admitting: Ophthalmology

## 2021-03-08 DIAGNOSIS — H401130 Primary open-angle glaucoma, bilateral, stage unspecified: Secondary | ICD-10-CM

## 2021-03-08 DIAGNOSIS — H35033 Hypertensive retinopathy, bilateral: Secondary | ICD-10-CM | POA: Diagnosis not present

## 2021-03-08 DIAGNOSIS — Z961 Presence of intraocular lens: Secondary | ICD-10-CM

## 2021-03-08 DIAGNOSIS — I10 Essential (primary) hypertension: Secondary | ICD-10-CM

## 2021-03-08 DIAGNOSIS — H3581 Retinal edema: Secondary | ICD-10-CM

## 2021-03-08 DIAGNOSIS — H35341 Macular cyst, hole, or pseudohole, right eye: Secondary | ICD-10-CM | POA: Diagnosis not present

## 2021-03-17 ENCOUNTER — Ambulatory Visit: Payer: Medicare HMO | Admitting: Podiatry

## 2021-03-25 ENCOUNTER — Ambulatory Visit: Payer: Medicare HMO | Admitting: Podiatry

## 2021-06-03 ENCOUNTER — Ambulatory Visit: Payer: Medicare HMO | Admitting: Podiatry

## 2021-06-09 ENCOUNTER — Encounter: Payer: Self-pay | Admitting: Podiatry

## 2021-06-09 ENCOUNTER — Other Ambulatory Visit: Payer: Self-pay

## 2021-06-09 ENCOUNTER — Ambulatory Visit: Payer: Medicare HMO | Admitting: Podiatry

## 2021-06-09 DIAGNOSIS — M79676 Pain in unspecified toe(s): Secondary | ICD-10-CM | POA: Diagnosis not present

## 2021-06-09 DIAGNOSIS — B351 Tinea unguium: Secondary | ICD-10-CM

## 2021-06-09 DIAGNOSIS — B353 Tinea pedis: Secondary | ICD-10-CM

## 2021-06-09 NOTE — Progress Notes (Signed)
This patient returns to the office for evaluation and treatment of long thick painful nails .  This patient is unable to trim her own nails since the patient cannot reach her feet.  Patient says the nails are painful walking and wearing her shoes. She returns for preventive foot care services. Relates she also has been having itching and scaling on her feet and was given a prescription anti-fungal.  General Appearance  Alert, conversant and in no acute stress.  Vascular  Dorsalis pedis and posterior tibial  pulses are palpable  bilaterally.  Capillary return is within normal limits  bilaterally. Temperature is within normal limits  bilaterally.  Neurologic  Senn-Weinstein monofilament wire test within normal limits  bilaterally. Muscle power within normal limits bilaterally.  Nails Thick disfigured discolored nails with subungual debris  from hallux to fifth toes bilaterally. No evidence of bacterial infection or drainage bilaterally.  Orthopedic  No limitations of motion  feet .  No crepitus or effusions noted.  HAV  B/L.  Hammer toes  B/L.  Skin  normotropic skin with no porokeratosis noted bilaterally.  No signs of infections or ulcers noted.   Scaling noted to plantar foot.   Onychomycosis  Pain in toes right foot  Pain in toes left foot  Debridement  of nails  1-5  B/L with a nail nipper.  Nails were then filed using a dremel tool with no incidents. Continue with anti-fungal cream. Follow-up with dermatology on Monday.      RTC 12 weeks.  Mercedes Fort, DPM  

## 2021-09-15 ENCOUNTER — Encounter: Payer: Self-pay | Admitting: Podiatry

## 2021-09-15 ENCOUNTER — Ambulatory Visit: Payer: Medicare HMO | Admitting: Podiatry

## 2021-09-15 DIAGNOSIS — M79676 Pain in unspecified toe(s): Secondary | ICD-10-CM

## 2021-09-15 DIAGNOSIS — B351 Tinea unguium: Secondary | ICD-10-CM | POA: Diagnosis not present

## 2021-09-15 NOTE — Progress Notes (Signed)
This patient returns to the office for evaluation and treatment of long thick painful nails .  This patient is unable to trim her own nails since the patient cannot reach her feet.  Patient says the nails are painful walking and wearing her shoes. She returns for preventive foot care services. Relates she also has been having itching and scaling on her feet and was given a prescription anti-fungal.  General Appearance  Alert, conversant and in no acute stress.  Vascular  Dorsalis pedis and posterior tibial  pulses are palpable  bilaterally.  Capillary return is within normal limits  bilaterally. Temperature is within normal limits  bilaterally.  Neurologic  Senn-Weinstein monofilament wire test within normal limits  bilaterally. Muscle power within normal limits bilaterally.  Nails Thick disfigured discolored nails with subungual debris  from hallux to fifth toes bilaterally. No evidence of bacterial infection or drainage bilaterally.  Orthopedic  No limitations of motion  feet .  No crepitus or effusions noted.  HAV  B/L.  Hammer toes  B/L.  Skin  normotropic skin with no porokeratosis noted bilaterally.  No signs of infections or ulcers noted.   Scaling noted to plantar foot.   Onychomycosis  Pain in toes right foot  Pain in toes left foot  Debridement  of nails  1-5  B/L with a nail nipper.  Nails were then filed using a dremel tool with no incidents. Continue with anti-fungal cream. Follow-up with dermatology on Monday.      RTC 12 weeks.  Anthoni Geerts, DPM  

## 2021-12-07 ENCOUNTER — Encounter (INDEPENDENT_AMBULATORY_CARE_PROVIDER_SITE_OTHER): Payer: Medicare HMO | Admitting: Ophthalmology

## 2021-12-15 ENCOUNTER — Ambulatory Visit: Payer: Medicare HMO | Admitting: Podiatry

## 2021-12-16 ENCOUNTER — Ambulatory Visit: Payer: Medicare HMO | Admitting: Podiatry

## 2021-12-26 NOTE — Progress Notes (Signed)
[facility] Note  12/28/2021     CHIEF COMPLAINT Patient presents for Retina Follow Up    HISTORY OF PRESENT ILLNESS: Kaitlyn Berry is a 86 y.o. female who presents to the clinic today for:   HPI     Retina Follow Up   Patient presents with  Other.  In right eye.  Severity is moderate.  Duration of 9 months.  Since onset it is stable.  I, the attending physician,  performed the HPI with the patient and updated documentation appropriately.        Comments   Pt here for 9 mo ret f/u mac hole OD. Pt states [facility] about the same, doesn't wear rx specs to see TV. Puffiness around OU.       Last edited by [name], MD on 12/30/2021  2:41 AM.    Pt states no change in vision  Referring physician: Gala Romney MD [place], [place] [id number]  HISTORICAL INFORMATION:   Selected notes from the MEDICAL RECORD NUMBER Referred by Dr. [name] for macular hole LEE: 11/17/2019 BCVA OD: 20/200 OS: 20/50 Ocular Hx- possible macular hole, pseudophakia OU PMH- HTN   CURRENT MEDICATIONS: Current Outpatient Medications (Ophthalmic Drugs)  Medication Sig   timolol (TIMOPTIC) 0.5 % ophthalmic solution Place 1 drop into the left eye daily.    atropine 1 % ophthalmic solution Place 1 drop into the right eye in the morning and at bedtime. (Patient not taking: Reported on 12/31/2019)   bacitracin-polymyxin b (POLYSPORIN) ophthalmic ointment Place 1 application into the right eye at bedtime. And as needed (Patient not taking: Reported on 09/06/2020)   gatifloxacin (ZYMAXID) 0.5 % SOLN Place 1 drop into the right eye. (Patient not taking: Reported on 12/31/2019)   No current facility-administered medications for this visit. (Ophthalmic Drugs)   Current Outpatient Medications (Other)  Medication Sig   acetaminophen (TYLENOL) 650 MG CR tablet Take 1,300 mg by mouth every 8 (eight) hours as needed for pain.   amLODipine (NORVASC) 10 MG tablet     aspirin EC 81 MG tablet Take 81 mg by mouth daily. Swallow whole.   celecoxib (CELEBREX) 100 MG capsule Take 100 mg by mouth 2 (two) times daily.   lamoTRIgine (LAMICTAL) 100 MG tablet    metoprolol succinate (TOPROL-XL) 100 MG 24 hr tablet Take 100 mg by mouth daily.   Multiple Vitamins-Minerals (PRESERVISION/LUTEIN) CAPS Take 1 capsule by mouth daily.   olmesartan-hydrochlorothiazide (BENICAR HCT) 40-25 MG tablet Take 1 tablet by mouth daily.    oxybutynin (DITROPAN) 5 MG tablet    potassium chloride (MICRO-K) 10 MEQ CR capsule Take 10 mEq by mouth daily.   traZODone (DESYREL) 50 MG tablet    ALPRAZolam (XANAX) 0.25 MG tablet Take 0.25 mg by mouth at bedtime as needed for anxiety. (Patient not taking: Reported on 09/06/2020)   amLODipine (NORVASC) 5 MG tablet Take 5 mg by mouth 2 (two) times daily.  (Patient not taking: Reported on 09/06/2020)   buPROPion (WELLBUTRIN XL) 150 MG 24 hr tablet  (Patient not taking: Reported on 09/06/2020)   Calcium Citrate-Vitamin D (CITRACAL + D PO) Take 600 mg by mouth daily. (Patient not taking: Reported on 09/06/2020)   No current facility-administered medications for this visit. (Other)   REVIEW OF SYSTEMS: ROS   Positive for: Musculoskeletal, Cardiovascular, Eyes Negative for: Constitutional, Gastrointestinal, Neurological, Skin, Genitourinary, HENT, Endocrine, Respiratory, Psychiatric, Allergic/Imm, Heme/Lymph Last edited by Kingsley Spittle, COT  on 12/28/2021  1:46 PM.      ALLERGIES Allergies  Allergen Reactions   Sulfa Antibiotics Rash   PAST MEDICAL HISTORY Past Medical History:  Diagnosis Date   Glaucoma    OU   Hypertension    Hypertensive retinopathy    OU   Macular degeneration    OU   Osteoarthritis of both knees    Seasonal allergies    Past Surgical History:  Procedure Laterality Date   25 GAUGE PARS PLANA VITRECTOMY WITH 20 GAUGE MVR PORT FOR MACULAR HOLE Right 12/04/2019   Procedure: 25 GAUGE PARS PLANA VITRECTOMY WITH  20 GAUGE MVR PORT FOR MACULAR HOLE;  Surgeon: [name], MD;  Location: [place];  Service: Ophthalmology;  Laterality: Right;   CATARACT EXTRACTION Bilateral    COLONOSCOPY     EYE SURGERY Bilateral    Cat Sx   EYE SURGERY Right 12/04/2019   [facility] - Dr. [name]   GAS INSERTION Right 12/04/2019   Procedure: INSERTION OF GAS - C3F8;  Surgeon: [name], MD;  Location: [place];  Service: Ophthalmology;  Laterality: Right;   GAS/FLUID EXCHANGE Right 12/04/2019   Procedure: GAS/FLUID EXCHANGE;  Surgeon: [name], MD;  Location: [place];  Service: Ophthalmology;  Laterality: Right;   MEMBRANE PEEL Right 12/04/2019   Procedure: MEMBRANE PEEL;  Surgeon: [name], MD;  Location: [place];  Service: Ophthalmology;  Laterality: Right;   PHOTOCOAGULATION WITH LASER Right 12/04/2019   Procedure: PHOTOCOAGULATION WITH LASER;  Surgeon: [name], MD;  Location: [place];  Service: Ophthalmology;  Laterality: Right;   TONSILLECTOMY     age 49 yrs old   4 TOOTH EXTRACTION     FAMILY HISTORY History reviewed. No pertinent family history.  SOCIAL HISTORY Social History   Tobacco Use   Smoking status: Never   Smokeless tobacco: Never  Vaping Use   Vaping Use: Never used  Substance Use Topics   Alcohol use: No   Drug use: No       OPHTHALMIC EXAM:  Base Eye Exam     Visual Acuity (Snellen - Linear)       Right Left   Dist [place] 20/50 20/50 +2   Dist ph [place] 20/30 -2 NI         Tonometry (Tonopen, 1:55 PM)       Right Left   Pressure 9 10         Pupils       Pupils Dark Light Shape React APD   Right PERRL 2 1.5 Round Slow None   Left PERRL 2 1.5 Round Slow None         Visual Fields (Counting fingers)       Left Right    Full Full         Extraocular Movement       Right Left    Full, Ortho Full, Ortho         Neuro/Psych     Oriented x3: Yes   Mood/Affect: Normal         Dilation     Both eyes: 1.0% Mydriacyl, 2.5%  Phenylephrine @ 1:55 PM           Slit Lamp and Fundus Exam     Slit Lamp Exam       Right Left   Lids/Lashes Dermatochalasis - upper lid, Meibomian gland dysfunction Dermatochalasis - upper lid, Meibomian gland dysfunction   Conjunctiva/Sclera Mild Melanosis Mild Melanosis  Cornea arcus, mild tear film debris arcus   Anterior Chamber deep and clear deep and clear   Iris Round and moderately dilated to 6.21m, Irregular pupil, mild horizontal oval in shape Round and moderately dilated to 6.010m  Lens 3 piece PCIOL, mild temporal displacement, Open posterior capsule 3 piece PC IOl in good position with open PC   Anterior Vitreous post vitrectomy; clear Vitreous syneresis         Fundus Exam       Right Left   Disc Mild Pallor, Sharp rim, +cupping, +PPP Very thin inferior rim, central cupping/pallor, temporal PPA, focal PPP   C/D Ratio 0.75 0.85   Macula Flat, mac hole closed, RPE mottling, No heme or edema Flat, Blunted foveal reflex, RPE mottling, clumping and early atrophy centrally, drusen   Vessels attenuated, Tortuous attenuated, Tortuous   Periphery Attached, mild peripheral drusen, peripheral laser scars, No heme Attached, midzonal and peripheral drusen, no heme            IMAGING AND PROCEDURES  Imaging and Procedures for 12/28/2021  OCT, Retina - OU - Both Eyes       Right Eye Quality was good. Central Foveal Thickness: 203. Progression has been stable. Findings include normal foveal contour, no IRF, no SRF, outer retinal atrophy (Mac hole closed, stable improvement in central ellipsoid signal).   Left Eye Quality was good. Central Foveal Thickness: 187. Progression has been stable. Findings include normal foveal contour, no IRF, no SRF, subretinal hyper-reflective material, outer retinal atrophy (Focal central ellipsoid disruption with focal SRHM within and ORA -- stable from prior, mild ERM inferiorly -- stable from prior).   Notes *Images captured and  stored on drive  Diagnosis / Impression:  OD: Mac hole closed, interval improvement in central ellipsoid signal OS: Focal central ellipsoid disruption and ORA -- stable from prior  Clinical management:  See below  Abbreviations: NFP - Normal foveal profile. CME - cystoid macular edema. PED - pigment epithelial detachment. IRF - intraretinal fluid. SRF - subretinal fluid. EZ - ellipsoid zone. ERM - epiretinal membrane. ORA - outer retinal atrophy. ORT - outer retinal tubulation. SRHM - subretinal hyper-reflective material. IRHM - intraretinal hyper-reflective material            ASSESSMENT/PLAN:    ICD-10-CM   1. Macular hole of right eye  H35.341 OCT, Retina - OU - Both Eyes    2. Essential hypertension  I10     3. Hypertensive retinopathy of both eyes  H35.033 OCT, Retina - OU - Both Eyes    4. Pseudophakia of both eyes  Z96.1     5. Primary open angle glaucoma of both eyes, unspecified glaucoma stage  H40.1130      1. Macular hole, right eye   - s/p PPV/TissueBlue/MP/14% C3F8 OD, 09.16.21             - doing well  - BCVA stable at 20/30              - mac hole closed, retina attached  - OCT shows interval improvement in central ellipsoid signal             - IOP good at 09  - pt is clear from retina standpoint for refraction and new glasses - f/u 9-12 months. DFE, OCT  2,3. Hypertensive retinopathy OU - discussed importance of tight BP control - monitor  4. Pseudophakia OU  - s/p CE/IOL OU ([place] - IOLs in good  position, doing well  - monitor  5. POAG  - under the expert management of Dr. [name]  - IOP today: 09,10  - on timoptic qam OU and latanoprost qhs OU -- pt states she is only using timoptic per Dr. [name]   Ophthalmic Meds Ordered this visit:  No orders of the defined types were placed in this encounter.    Return for f/u 9-12 months, mac hole OD, DFE, OCT.  There are no Patient Instructions on file for this visit.  This document  serves as a record of services personally performed by Gardiner Sleeper, MD, PhD. It was created on their behalf by Orvan Falconer, an ophthalmic technician. The creation of this record is the provider's dictation and/or activities during the visit.    Electronically signed by: Orvan Falconer, OA, 12/30/21  2:46 AM  This document serves as a record of services personally performed by Gardiner Sleeper, MD, PhD. It was created on their behalf by San Jetty. Owens Shark, OA an ophthalmic technician. The creation of this record is the provider's dictation and/or activities during the visit.    Electronically signed by: San Jetty. [place], [place] 10.11.2023 2:46 AM  Gardiner Sleeper, M.D., Ph.D. Diseases & Surgery of the Retina and Vitreous [facility]  I have reviewed the above documentation for accuracy and completeness, and I agree with the above. Gardiner Sleeper, M.D., Ph.D. 12/30/21 2:46 AM   Abbreviations: M myopia (nearsighted); A astigmatism; H hyperopia (farsighted); P presbyopia; Mrx spectacle prescription;  CTL contact lenses; OD right eye; OS left eye; OU both eyes  XT exotropia; ET esotropia; PEK punctate epithelial keratitis; PEE punctate epithelial erosions; DES dry eye syndrome; MGD meibomian gland dysfunction; ATs artificial tears; PFAT's preservative free artificial tears; [place] nuclear sclerotic cataract; PSC posterior subcapsular cataract; ERM epi-retinal membrane; PVD posterior vitreous detachment; RD retinal detachment; DM diabetes mellitus; DR diabetic retinopathy; NPDR non-proliferative diabetic retinopathy; PDR proliferative diabetic retinopathy; CSME clinically significant macular edema; DME diabetic macular edema; dbh dot blot hemorrhages; CWS cotton wool spot; POAG primary open angle glaucoma; C/D cup-to-disc ratio; HVF humphrey visual field; GVF goldmann visual field; OCT optical coherence tomography; IOP intraocular pressure; BRVO Branch retinal vein occlusion; CRVO  central retinal vein occlusion; CRAO central retinal artery occlusion; BRAO branch retinal artery occlusion; RT retinal tear; SB scleral buckle; PPV pars plana vitrectomy; VH Vitreous hemorrhage; PRP panretinal laser photocoagulation; IVK intravitreal kenalog; VMT vitreomacular traction; MH Macular hole;  NVD neovascularization of the disc; NVE neovascularization elsewhere; AREDS age related eye disease study; ARMD age related macular degeneration; POAG primary open angle glaucoma; EBMD epithelial/anterior basement membrane dystrophy; ACIOL anterior chamber intraocular lens; IOL intraocular lens; PCIOL posterior chamber intraocular lens; Phaco/IOL phacoemulsification with intraocular lens placement; [place] photorefractive keratectomy; LASIK laser assisted in situ keratomileusis; HTN hypertension; DM diabetes mellitus; COPD chronic obstructive pulmonary disease

## 2021-12-28 ENCOUNTER — Encounter (INDEPENDENT_AMBULATORY_CARE_PROVIDER_SITE_OTHER): Payer: Self-pay | Admitting: Ophthalmology

## 2021-12-28 ENCOUNTER — Ambulatory Visit (INDEPENDENT_AMBULATORY_CARE_PROVIDER_SITE_OTHER): Payer: Medicare HMO | Admitting: Ophthalmology

## 2021-12-28 DIAGNOSIS — Z961 Presence of intraocular lens: Secondary | ICD-10-CM | POA: Diagnosis not present

## 2021-12-28 DIAGNOSIS — H35033 Hypertensive retinopathy, bilateral: Secondary | ICD-10-CM

## 2021-12-28 DIAGNOSIS — H401130 Primary open-angle glaucoma, bilateral, stage unspecified: Secondary | ICD-10-CM

## 2021-12-28 DIAGNOSIS — H35341 Macular cyst, hole, or pseudohole, right eye: Secondary | ICD-10-CM | POA: Diagnosis not present

## 2021-12-28 DIAGNOSIS — I10 Essential (primary) hypertension: Secondary | ICD-10-CM | POA: Diagnosis not present

## 2021-12-30 ENCOUNTER — Encounter (INDEPENDENT_AMBULATORY_CARE_PROVIDER_SITE_OTHER): Payer: Self-pay | Admitting: Ophthalmology

## 2022-01-12 ENCOUNTER — Ambulatory Visit: Payer: Medicare HMO | Admitting: Physician Assistant

## 2022-01-13 ENCOUNTER — Other Ambulatory Visit: Payer: Self-pay | Admitting: Internal Medicine

## 2022-01-14 LAB — LIPID PANEL
Cholesterol: 168 mg/dL (ref <200)
HDL: 49 mg/dL — ABNORMAL LOW (ref >=50)
LDL Cholesterol (Calc): 93 mg/dL (calc)
Non-HDL Cholesterol (Calc): 119 mg/dL (calc) (ref <130)
Total CHOL/HDL Ratio: 3.4 (calc) (ref <5.0)
Triglycerides: 160 mg/dL — ABNORMAL HIGH (ref <150)

## 2022-01-14 LAB — COMPLETE METABOLIC PANEL WITH GFR
AG Ratio: 1.6 (calc) (ref 1.0–2.5)
ALT: 19 U/L (ref 6–29)
AST: 16 U/L (ref 10–35)
Albumin: 3.8 g/dL (ref 3.6–5.1)
Alkaline phosphatase (APISO): 69 U/L (ref 37–153)
BUN: 18 mg/dL (ref 7–25)
CO2: 29 mmol/L (ref 20–32)
Calcium: 9.2 mg/dL (ref 8.6–10.4)
Chloride: 100 mmol/L (ref 98–110)
Creat: 0.76 mg/dL (ref 0.60–0.95)
Globulin: 2.4 g/dL (calc) (ref 1.9–3.7)
Glucose, Bld: 187 mg/dL — ABNORMAL HIGH (ref 65–99)
Potassium: 3.8 mmol/L (ref 3.5–5.3)
Sodium: 139 mmol/L (ref 135–146)
Total Bilirubin: 0.2 mg/dL (ref 0.2–1.2)
Total Protein: 6.2 g/dL (ref 6.1–8.1)
eGFR: 78 mL/min/{1.73_m2} (ref >=60)

## 2022-01-14 LAB — CBC
HCT: 31.3 % — ABNORMAL LOW (ref 35.0–45.0)
Hemoglobin: 10.5 g/dL — ABNORMAL LOW (ref 11.7–15.5)
MCH: 32.3 pg (ref 27.0–33.0)
MCHC: 33.5 g/dL (ref 32.0–36.0)
MCV: 96.3 fL (ref 80.0–100.0)
MPV: 8.7 fL (ref 7.5–12.5)
Platelets: 352 10*3/uL (ref 140–400)
RBC: 3.25 10*6/uL — ABNORMAL LOW (ref 3.80–5.10)
RDW: 11.8 % (ref 11.0–15.0)
WBC: 7.2 10*3/uL (ref 3.8–10.8)

## 2022-01-14 LAB — TSH: TSH: 0.96 mIU/L (ref 0.40–4.50)

## 2022-04-03 ENCOUNTER — Ambulatory Visit: Payer: Self-pay | Admitting: Physician Assistant

## 2022-05-05 ENCOUNTER — Ambulatory Visit: Payer: Medicare HMO | Admitting: Orthopedic Surgery

## 2022-06-15 ENCOUNTER — Encounter: Payer: Self-pay | Admitting: Podiatry

## 2022-06-15 ENCOUNTER — Ambulatory Visit: Payer: Medicare HMO | Admitting: Podiatry

## 2022-06-15 DIAGNOSIS — M79676 Pain in unspecified toe(s): Secondary | ICD-10-CM | POA: Diagnosis not present

## 2022-06-15 DIAGNOSIS — B351 Tinea unguium: Secondary | ICD-10-CM | POA: Diagnosis not present

## 2022-06-15 NOTE — Progress Notes (Signed)
This patient returns to the office for evaluation and treatment of long thick painful nails .  This patient is unable to trim her own nails since the patient cannot reach her feet.  Patient says the nails are painful walking and wearing her shoes. She returns for preventive foot care services. Relates she also has been having itching and scaling on her feet and was given a prescription anti-fungal.  General Appearance  Alert, conversant and in no acute stress.  Vascular  Dorsalis pedis and posterior tibial  pulses are palpable  bilaterally.  Capillary return is within normal limits  bilaterally. Temperature is within normal limits  bilaterally.  Neurologic  Senn-Weinstein monofilament wire test within normal limits  bilaterally. Muscle power within normal limits bilaterally.  Nails Thick disfigured discolored nails with subungual debris  from hallux to fifth toes bilaterally. No evidence of bacterial infection or drainage bilaterally.  Orthopedic  No limitations of motion  feet .  No crepitus or effusions noted.  HAV  B/L.  Hammer toes  B/L.  Skin  normotropic skin with no porokeratosis noted bilaterally.  No signs of infections or ulcers noted.   Scaling noted to plantar foot.   Onychomycosis  Pain in toes right foot  Pain in toes left foot  Debridement  of nails  1-5  B/L with a nail nipper.  Nails were then filed using a dremel tool with no incidents. Continue with anti-fungal cream. Follow-up with dermatology on Monday.      RTC 12 weeks.  Lorenda Peck, DPM

## 2022-09-14 ENCOUNTER — Ambulatory Visit: Payer: Medicare HMO | Admitting: Podiatry

## 2022-09-26 ENCOUNTER — Encounter (INDEPENDENT_AMBULATORY_CARE_PROVIDER_SITE_OTHER): Payer: Medicare HMO | Admitting: Ophthalmology

## 2022-09-26 NOTE — Progress Notes (Signed)
[facility] - Clinic Note  10/03/2022     CHIEF COMPLAINT Patient presents for Retina Follow Up    HISTORY OF PRESENT ILLNESS: Kaitlyn Berry is a 87 y.o. female who presents to the clinic today for:   HPI     Retina Follow Up   Patient presents with  Other.  In right eye.  This started 9 months ago.  I, the attending physician,  performed the HPI with the patient and updated documentation appropriately.        Comments   Patient here for 9 months retina follow up for mac hole OD. Patient states vision OS is very poor. OD gets puffy lid area. Sometimes has eye pain. Doesn't stay very long in AM.       Last edited by Rennis Chris, MD on 10/03/2022 11:39 PM.     Pt is going to see Dr. Sherrine Maples in August, pt feels like she doesn't see that well out of her left eye, but she is still able to paint  Referring physician: Shari Prows MD [address] [place], [place] [address]  HISTORICAL INFORMATION:   Selected notes from the MEDICAL RECORD NUMBER Referred by Dr. Sherrine Maples for macular hole LEE: 11/17/2019 BCVA OD: 20/200 OS: 20/50 Ocular Hx- possible macular hole, pseudophakia OU PMH- HTN   CURRENT MEDICATIONS: Current Outpatient Medications (Ophthalmic Drugs)  Medication Sig   atropine 1 % ophthalmic solution Place 1 drop into the right eye in the morning and at bedtime. (Patient not taking: Reported on 12/31/2019)   bacitracin-polymyxin b (POLYSPORIN) ophthalmic ointment Place 1 application into the right eye at bedtime. And as needed (Patient not taking: Reported on 09/06/2020)   gatifloxacin (ZYMAXID) 0.5 % SOLN Place 1 drop into the right eye. (Patient not taking: Reported on 12/31/2019)   timolol (TIMOPTIC) 0.5 % ophthalmic solution Place 1 drop into both eyes daily.   No current facility-administered medications for this visit. (Ophthalmic Drugs)   Current Outpatient Medications (Other)  Medication Sig   acetaminophen (TYLENOL) 650 MG CR  tablet Take 1,300 mg by mouth every 8 (eight) hours as needed for pain.   amLODipine (NORVASC) 10 MG tablet    aspirin EC 81 MG tablet Take 81 mg by mouth daily. Swallow whole.   celecoxib (CELEBREX) 100 MG capsule Take 100 mg by mouth 2 (two) times daily.   lamoTRIgine (LAMICTAL) 100 MG tablet    metoprolol succinate (TOPROL-XL) 100 MG 24 hr tablet Take 100 mg by mouth daily.   Multiple Vitamins-Minerals (PRESERVISION/LUTEIN) CAPS Take 1 capsule by mouth daily.   olmesartan-hydrochlorothiazide (BENICAR HCT) 40-25 MG tablet Take 1 tablet by mouth daily.    oxybutynin (DITROPAN) 5 MG tablet    potassium chloride (MICRO-K) 10 MEQ CR capsule Take 10 mEq by mouth daily.   traZODone (DESYREL) 50 MG tablet    ALPRAZolam (XANAX) 0.25 MG tablet Take 0.25 mg by mouth at bedtime as needed for anxiety. (Patient not taking: Reported on 09/06/2020)   amLODipine (NORVASC) 5 MG tablet Take 5 mg by mouth 2 (two) times daily.  (Patient not taking: Reported on 09/06/2020)   buPROPion (WELLBUTRIN XL) 150 MG 24 hr tablet  (Patient not taking: Reported on 09/06/2020)   Calcium Citrate-Vitamin D (CITRACAL + D PO) Take 600 mg by mouth daily. (Patient not taking: Reported on 09/06/2020)   No current facility-administered medications for this visit. (Other)   REVIEW OF SYSTEMS: ROS   Positive for: Musculoskeletal, Cardiovascular, Eyes  Negative for: Constitutional, Gastrointestinal, Neurological, Skin, Genitourinary, HENT, Endocrine, Respiratory, Psychiatric, Allergic/Imm, Heme/Lymph Last edited by Laddie Aquas, COA on 10/03/2022  2:43 PM.       ALLERGIES Allergies  Allergen Reactions   Sulfa Antibiotics Rash   PAST MEDICAL HISTORY Past Medical History:  Diagnosis Date   Glaucoma    OU   Hypertension    Hypertensive retinopathy    OU   Macular degeneration    OU   Osteoarthritis of both knees    Seasonal allergies    Past Surgical History:  Procedure Laterality Date   25 GAUGE PARS PLANA  VITRECTOMY WITH 20 GAUGE MVR PORT FOR MACULAR HOLE Right 12/04/2019   Procedure: 25 GAUGE PARS PLANA VITRECTOMY WITH 20 GAUGE MVR PORT FOR MACULAR HOLE;  Surgeon: Rennis Chris, MD;  Location: [facility] OR;  Service: Ophthalmology;  Laterality: Right;   CATARACT EXTRACTION Bilateral    COLONOSCOPY     EYE SURGERY Bilateral    Cat Sx   EYE SURGERY Right 12/04/2019   [facility] Repair - Dr. Rennis Chris   GAS INSERTION Right 12/04/2019   Procedure: INSERTION OF GAS - C3F8;  Surgeon: Rennis Chris, MD;  Location: [facility] OR;  Service: Ophthalmology;  Laterality: Right;   GAS/FLUID EXCHANGE Right 12/04/2019   Procedure: GAS/FLUID EXCHANGE;  Surgeon: Rennis Chris, MD;  Location: [facility] OR;  Service: Ophthalmology;  Laterality: Right;   MEMBRANE PEEL Right 12/04/2019   Procedure: MEMBRANE PEEL;  Surgeon: Rennis Chris, MD;  Location: [facility] OR;  Service: Ophthalmology;  Laterality: Right;   PHOTOCOAGULATION WITH LASER Right 12/04/2019   Procedure: PHOTOCOAGULATION WITH LASER;  Surgeon: Rennis Chris, MD;  Location: [facility] OR;  Service: Ophthalmology;  Laterality: Right;   TONSILLECTOMY     age 53 yrs old   WISDOM TOOTH EXTRACTION     FAMILY HISTORY History reviewed. No pertinent family history.  SOCIAL HISTORY Social History   Tobacco Use   Smoking status: Never   Smokeless tobacco: Never  Vaping Use   Vaping status: Never Used  Substance Use Topics   Alcohol use: No   Drug use: No       OPHTHALMIC EXAM:  Base Eye Exam     Visual Acuity (Snellen - Linear)       Right Left   Dist [place] 20/50 -2 20/50 -2   Dist ph [place] 20/30 -2 20/50 +2         Tonometry (Tonopen, 2:40 PM)       Right Left   Pressure 11 10         Pupils       Dark Light Shape React APD   Right 2 1.5 Round Brisk None   Left 2 1.5 Round Brisk None         Visual Fields (Counting fingers)       Left Right    Full Full         Extraocular Movement       Right Left    Full, Ortho Full, Ortho         Neuro/Psych      Oriented x3: Yes   Mood/Affect: Normal         Dilation     Both eyes: 1.0% Mydriacyl, 2.5% Phenylephrine @ 2:40 PM           Slit Lamp and Fundus Exam     Slit Lamp Exam       Right Left   Lids/Lashes Dermatochalasis - upper  lid, Meibomian gland dysfunction Dermatochalasis - upper lid, Meibomian gland dysfunction   Conjunctiva/Sclera Mild Melanosis Mild Melanosis, nasal and temporal pinguecula   Cornea arcus, tear film debris, fine endo pigment, well healed cataract wound arcus, tear film debris, well healed cataract wound   Anterior Chamber deep and clear deep and clear   Iris Round and moderately dilated to 6.27mm, Irregular pupil, mild horizontal oval in shape Round and moderately dilated to 6.24mm   Lens 3 piece PCIOL, mild temporal displacement, Open posterior capsule 3 piece PC IOL in good position with open PC   Anterior Vitreous post vitrectomy; clear mild syneresis         Fundus Exam       Right Left   Disc 1-2+Pallor, Sharp rim, +cupping, +PPP Very thin inferior rim, 1-2+pallor, temporal PPA, focal PPP, +cupping   C/D Ratio 0.8 0.85   Macula Flat, mac hole closed, RPE mottling, No heme or edema Flat, Blunted foveal reflex, RPE mottling, clumping and early atrophy centrally, drusen, mild ERM   Vessels attenuated, Tortuous attenuated, Tortuous   Periphery Attached, mild peripheral drusen, peripheral laser scars, No heme Attached, midzonal and peripheral drusen, no heme           Refraction     Wearing Rx       Sphere Cylinder Axis Add   Right -1.50 +1.00 175 +2.50   Left -1.25 +1.25 005 +2.50    Type: PAL            IMAGING AND PROCEDURES  Imaging and Procedures for 10/03/2022  OCT, Retina - OU - Both Eyes       Right Eye Quality was good. Central Foveal Thickness: 228. Progression has improved. Findings include normal foveal contour, no IRF, no SRF, outer retinal atrophy (Mac hole closed, interval improvement in central ellipsoid  signal).   Left Eye Quality was good. Central Foveal Thickness: 189. Progression has been stable. Findings include normal foveal contour, no IRF, no SRF, subretinal hyper-reflective material, outer retinal atrophy (Focal central ellipsoid disruption with focal SRHM within and ORA -- stable from prior, mild ERM inferiorly -- stable from prior).   Notes *Images captured and stored on drive  Diagnosis / Impression:  OD: Mac hole closed, interval improvement in central ellipsoid signal OS: Focal central ellipsoid disruption and ORA -- stable from prior  Clinical management:  See below  Abbreviations: NFP - Normal foveal profile. CME - cystoid macular edema. PED - pigment epithelial detachment. IRF - intraretinal fluid. SRF - subretinal fluid. EZ - ellipsoid zone. ERM - epiretinal membrane. ORA - outer retinal atrophy. ORT - outer retinal tubulation. SRHM - subretinal hyper-reflective material. IRHM - intraretinal hyper-reflective material            ASSESSMENT/PLAN:    ICD-10-CM   1. Macular hole of right eye  H35.341 OCT, Retina - OU - Both Eyes    2. Essential hypertension  I10     3. Hypertensive retinopathy of both eyes  H35.033     4. Pseudophakia of both eyes  Z96.1     5. Primary open angle glaucoma of both eyes, unspecified glaucoma stage  H40.1130       1. Macular hole, right eye   - s/p PPV/TissueBlue/MP/14% C3F8 OD, 09.16.21             - doing well  - BCVA stable at 20/30              - mac hole closed, retina  attached  - OCT shows interval improvement in central ellipsoid signal             - IOP good at 11 - f/u 9 months. DFE, OCT  2,3. Hypertensive retinopathy OU - discussed importance of tight BP control - monitor  4. Pseudophakia OU  - s/p CE/IOL OU ([facility])  - IOLs in good position, doing well  - monitor  5. POAG  - under the expert management of Dr. Sherrine Maples  - IOP today: 11,10  - cont Timoptic qam OU and Latanoprost qhs OU    Ophthalmic Meds Ordered this visit:  Meds ordered this encounter  Medications   timolol (TIMOPTIC) 0.5 % ophthalmic solution    Sig: Place 1 drop into both eyes daily.    Dispense:  10 mL    Refill:  0     Return in about 9 months (around 07/04/2023) for f/u mac hole OD, DFE, OCT.  There are no Patient Instructions on file for this visit.  This document serves as a record of services personally performed by Karie Chimera, MD, PhD. It was created on their behalf by Gerilyn Nestle, COT an ophthalmic technician. The creation of this record is the provider's dictation and/or activities during the visit.    Electronically signed by:  Charlette Caffey, COT  10/03/22 11:40 PM  This document serves as a record of services personally performed by Karie Chimera, MD, PhD. It was created on their behalf by Glee Arvin. Manson Passey, OA an ophthalmic technician. The creation of this record is the provider's dictation and/or activities during the visit.    Electronically signed by: Glee Arvin. Manson Passey, OA 10/03/22 11:40 PM   Karie Chimera, M.D., Ph.D. Diseases & Surgery of the Retina and Vitreous [facility]  I have reviewed the above documentation for accuracy and completeness, and I agree with the above. Karie Chimera, M.D., Ph.D. 10/03/22 11:41 PM   Abbreviations: M myopia (nearsighted); A astigmatism; H hyperopia (farsighted); P presbyopia; Mrx spectacle prescription;  CTL contact lenses; OD right eye; OS left eye; OU both eyes  XT exotropia; ET esotropia; PEK punctate epithelial keratitis; PEE punctate epithelial erosions; DES dry eye syndrome; MGD meibomian gland dysfunction; ATs artificial tears; PFAT's preservative free artificial tears; NSC nuclear sclerotic cataract; PSC posterior subcapsular cataract; ERM epi-retinal membrane; PVD posterior vitreous detachment; RD retinal detachment; DM diabetes mellitus; DR diabetic retinopathy; NPDR non-proliferative diabetic  retinopathy; PDR proliferative diabetic retinopathy; CSME clinically significant macular edema; DME diabetic macular edema; dbh dot blot hemorrhages; CWS cotton wool spot; POAG primary open angle glaucoma; C/D cup-to-disc ratio; HVF humphrey visual field; GVF goldmann visual field; OCT optical coherence tomography; IOP intraocular pressure; BRVO Branch retinal vein occlusion; CRVO central retinal vein occlusion; CRAO central retinal artery occlusion; BRAO branch retinal artery occlusion; RT retinal tear; SB scleral buckle; PPV pars plana vitrectomy; VH Vitreous hemorrhage; PRP panretinal laser photocoagulation; IVK intravitreal kenalog; VMT vitreomacular traction; MH Macular hole;  NVD neovascularization of the disc; NVE neovascularization elsewhere; AREDS age related eye disease study; ARMD age related macular degeneration; POAG primary open angle glaucoma; EBMD epithelial/anterior basement membrane dystrophy; ACIOL anterior chamber intraocular lens; IOL intraocular lens; PCIOL posterior chamber intraocular lens; Phaco/IOL phacoemulsification with intraocular lens placement; PRK photorefractive keratectomy; LASIK laser assisted in situ keratomileusis; HTN hypertension; DM diabetes mellitus; COPD chronic obstructive pulmonary disease

## 2022-10-03 ENCOUNTER — Ambulatory Visit (INDEPENDENT_AMBULATORY_CARE_PROVIDER_SITE_OTHER): Payer: Medicare HMO | Admitting: Ophthalmology

## 2022-10-03 ENCOUNTER — Encounter (INDEPENDENT_AMBULATORY_CARE_PROVIDER_SITE_OTHER): Payer: Self-pay | Admitting: Ophthalmology

## 2022-10-03 DIAGNOSIS — H35033 Hypertensive retinopathy, bilateral: Secondary | ICD-10-CM

## 2022-10-03 DIAGNOSIS — I10 Essential (primary) hypertension: Secondary | ICD-10-CM | POA: Diagnosis not present

## 2022-10-03 DIAGNOSIS — Z961 Presence of intraocular lens: Secondary | ICD-10-CM | POA: Diagnosis not present

## 2022-10-03 DIAGNOSIS — H35341 Macular cyst, hole, or pseudohole, right eye: Secondary | ICD-10-CM

## 2022-10-03 DIAGNOSIS — H401130 Primary open-angle glaucoma, bilateral, stage unspecified: Secondary | ICD-10-CM

## 2022-10-03 MED ORDER — TIMOLOL MALEATE 0.5 % OP SOLN: 1.0000 [drp] | Freq: Every day | OPHTHALMIC | 0 refills | Status: DC

## 2022-10-05 ENCOUNTER — Encounter: Payer: Self-pay | Admitting: Podiatry

## 2022-10-05 ENCOUNTER — Ambulatory Visit: Payer: Medicare HMO | Admitting: Podiatry

## 2022-10-05 DIAGNOSIS — B351 Tinea unguium: Secondary | ICD-10-CM | POA: Diagnosis not present

## 2022-10-05 DIAGNOSIS — B353 Tinea pedis: Secondary | ICD-10-CM

## 2022-10-05 DIAGNOSIS — M79676 Pain in unspecified toe(s): Secondary | ICD-10-CM | POA: Diagnosis not present

## 2022-10-05 MED ORDER — KETOCONAZOLE 2 % EX CREA: 1.0000 | TOPICAL_CREAM | Freq: Every day | CUTANEOUS | 2 refills | Status: AC

## 2022-10-05 NOTE — Progress Notes (Signed)
This patient returns to the office for evaluation and treatment of long thick painful nails .  This patient is unable to trim her own nails since the patient cannot reach her feet.  Patient says the nails are painful walking and wearing her shoes. She returns for preventive foot care services. Relates she also has been having itching and scaling on her feet   General Appearance  Alert, conversant and in no acute stress.  Vascular  Dorsalis pedis and posterior tibial  pulses are palpable  bilaterally.  Capillary return is within normal limits  bilaterally. Temperature is within normal limits  bilaterally.  Neurologic  Senn-Weinstein monofilament wire test within normal limits  bilaterally. Muscle power within normal limits bilaterally.  Nails Thick disfigured discolored nails with subungual debris  from hallux to fifth toes bilaterally. No evidence of bacterial infection or drainage bilaterally.  Orthopedic  No limitations of motion  feet .  No crepitus or effusions noted.  HAV  B/L.  Hammer toes  B/L.  Skin  normotropic skin with no porokeratosis noted bilaterally.  No signs of infections or ulcers noted.   Scaling noted to plantar foot.   Onychomycosis  Pain in toes right foot  Pain in toes left foot  Debridement  of nails  1-5  B/L with a nail nipper.  Nails were then filed using a dremel tool with no incidents. Continue with anti-fungal cream.       RTC 12 weeks.  Louann Sjogren, DPM

## 2022-12-23 ENCOUNTER — Other Ambulatory Visit (INDEPENDENT_AMBULATORY_CARE_PROVIDER_SITE_OTHER): Payer: Self-pay | Admitting: Ophthalmology

## 2022-12-26 ENCOUNTER — Encounter: Payer: Self-pay | Admitting: Podiatry

## 2022-12-26 ENCOUNTER — Ambulatory Visit: Payer: Medicare HMO | Admitting: Podiatry

## 2022-12-26 DIAGNOSIS — M79676 Pain in unspecified toe(s): Secondary | ICD-10-CM | POA: Diagnosis not present

## 2022-12-26 DIAGNOSIS — B351 Tinea unguium: Secondary | ICD-10-CM | POA: Diagnosis not present

## 2022-12-26 NOTE — Progress Notes (Signed)

## 2023-01-01 ENCOUNTER — Other Ambulatory Visit: Payer: Self-pay | Admitting: Radiology

## 2023-01-01 ENCOUNTER — Telehealth: Payer: Self-pay | Admitting: Radiology

## 2023-01-01 ENCOUNTER — Other Ambulatory Visit (INDEPENDENT_AMBULATORY_CARE_PROVIDER_SITE_OTHER): Payer: Medicare HMO

## 2023-01-01 ENCOUNTER — Other Ambulatory Visit (INDEPENDENT_AMBULATORY_CARE_PROVIDER_SITE_OTHER): Payer: Self-pay

## 2023-01-01 ENCOUNTER — Ambulatory Visit (INDEPENDENT_AMBULATORY_CARE_PROVIDER_SITE_OTHER): Payer: Medicare HMO | Admitting: Physician Assistant

## 2023-01-01 DIAGNOSIS — M1711 Unilateral primary osteoarthritis, right knee: Secondary | ICD-10-CM

## 2023-01-01 DIAGNOSIS — M5441 Lumbago with sciatica, right side: Secondary | ICD-10-CM

## 2023-01-01 DIAGNOSIS — G8929 Other chronic pain: Secondary | ICD-10-CM | POA: Diagnosis not present

## 2023-01-01 MED ORDER — TIMOLOL MALEATE 0.5 % OP SOLN: 1.0000 [drp] | Freq: Every day | OPHTHALMIC | 1 refills | Status: AC

## 2023-01-01 MED ORDER — METHYLPREDNISOLONE ACETATE 40 MG/ML IJ SUSP
40.0000 mg | INTRAMUSCULAR | Status: AC | PRN
Start: 2023-01-01 — End: 2023-01-01
  Administered 2023-01-01: 40 mg via INTRA_ARTICULAR

## 2023-01-01 MED ORDER — LIDOCAINE HCL 1 % IJ SOLN
3.0000 mL | INTRAMUSCULAR | Status: AC | PRN
Start: 2023-01-01 — End: 2023-01-01
  Administered 2023-01-01: 3 mL

## 2023-01-01 NOTE — Telephone Encounter (Signed)
Right knee gel injection  

## 2023-01-01 NOTE — Progress Notes (Signed)
Office Visit Note   Patient: Kaitlyn Berry           Date of Birth: 1935-07-29           MRN: [id number] Visit Date: 01/01/2023              Requested by: Fleet Contras, MD [address] [place],  [place] [address] PCP: Fleet Contras, MD   Assessment & Plan: Visit Diagnoses:  1. Primary osteoarthritis of right knee   2. Chronic right-sided low back pain with right-sided sciatica     Plan: She is not interested in any surgery on her right knee or her back.  She would like to see Dr. Christell Constant here in our office and see if he has any suggestions in regards to her low back pain.  In regards to her right knee she would like to try gel injection in the knee.  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  Therefore she was agreeable to a cortisone injection of the right knee today.  Questions were encouraged and answered at length.  Will have her back once viscosupplementation injections available.  Follow-Up Instructions: Return for Supplemental injection.   Orders:  Orders Placed This Encounter  Procedures   Large Joint Inj   XR Knee 1-2 Views Right   XR Lumbar Spine 2-3 Views   No orders of the defined types were placed in this encounter.     Procedures: Large Joint Inj: R knee on 01/01/2023 9:02 AM Indications: pain Details: 22 G 1.5 in needle, anterolateral approach  Arthrogram: No  Medications: 3 mL lidocaine 1 %; 40 mg methylPREDNISolone acetate 40 MG/ML Outcome: tolerated well, no immediate complications Procedure, treatment alternatives, risks and benefits explained, specific risks discussed. Consent was given by the patient. Immediately prior to procedure a time out was called to verify the correct patient, procedure, equipment, support staff and site/side marked as required. Patient was prepped and draped in the usual sterile fashion.       Clinical Data: No additional  findings.   Subjective: Chief Complaint  Patient presents with   Right Knee - Pain    HPI patient is 87 year old female who last saw in 2019.  She comes in today with right knee pain and low back pain.  States that her knee pain is getting worse.  She has had no injury to the knee.  Pain is worse in the morning.  Denies any swelling in the knee. Low back pain is worse in the evening.  She ranks her pain to be 7 out of 10 at worst.  She states she has difficulty getting up from a seated position due to the pain low back.  Pain radiates from the low back down the right leg to the knee.  She reports that she saw neurosurgeon here in town after we last saw her in 2019 that she underwent epidural steroid injections approximately 7 injections.  She was not pleased with their care feels that she was just rushed through.  She is unsure exactly who she saw in the past.  She is having no waking pain, no bowel or bladder changes, no saddle anesthesia like symptoms, or no weight change.  She does take ibuprofen which helps if she has had a long day.  She has tried pain patches and Tylenol.  She reports that she is very active and has a workout  routine.  Prior lumbar spine MRI 2019 showed severe central canal stenosis at L4-5 also moderate to moderately severe foraminal stenosis left greater than right at L4-5.  Moderate to severe central canal and right foraminal stenosis L5 -S 1.  Review of Systems  Constitutional:  Negative for chills and fever.  Musculoskeletal:  Positive for back pain.     Objective: Vital Signs: LMP  (LMP Unknown)   Physical Exam Constitutional:      Appearance: She is not ill-appearing or diaphoretic.  Pulmonary:     Effort: Pulmonary effort is normal.  Neurological:     Mental Status: She is alert and oriented to person, place, and time.  Psychiatric:        Mood and Affect: Mood normal.     Ortho Exam Bilateral knees: Full range of motion of both knees.  No varus  deformities bilaterally.  Significant patellofemoral crepitus with passive range of motion both knees.  No instability valgus varus stressing of either knee.  Tenderness along medial joint line of both knees.  No abnormal warmth erythema or effusion of either knee Lower extremities: 5 out of 5 strength throughout against resistance.  Negative straight leg raise bilaterally.  Tight hamstrings.  Dorsiflexion plantarflexion intact bilaterally.  Specialty Comments:  No specialty comments available.  Imaging: XR Lumbar Spine 2-3 Views  Result Date: 01/01/2023 Lumbar spine 2 views: Loss of lordotic curvature.  Grade 1 spondylolisthesis L4 on L5.  No acute findings of acute fractures.  Radiographs remain virtually unchanged from prior films in 2019 of the lumbar spine.  XR Knee 1-2 Views Right  Result Date: 01/01/2023 Right knee 2 views: Bone-on-bone medial compartment with varus deformity.  Sclerotic changes medial joint line particularly over the tibial plateau.  No acute fracture.  Mild to moderate patellofemoral arthritic changes.  No acute findings.    PMFS History: There are no problems to display for this patient.  Past Medical History:  Diagnosis Date   Glaucoma    OU   Hypertension    Hypertensive retinopathy    OU   Macular degeneration    OU   Osteoarthritis of both knees    Seasonal allergies     No family history on file.  Past Surgical History:  Procedure Laterality Date   25 GAUGE PARS PLANA VITRECTOMY WITH 20 GAUGE MVR PORT FOR MACULAR HOLE Right 12/04/2019   Procedure: 25 GAUGE PARS PLANA VITRECTOMY WITH 20 GAUGE MVR PORT FOR MACULAR HOLE;  Surgeon: Rennis Chris, MD;  Location[facility] OR;  Service: Ophthalmology;  Laterality: Right;   CATARACT EXTRACTION Bilateral    COLONOSCOPY     EYE SURGERY Bilateral    Cat Sx   EYE SURGERY Right 12/04/2019   [facility] Repair - Dr. Rennis Chris   GAS INSERTION Right 12/04/2019   Procedure: INSERTION OF GAS - C3F8;  Surgeon:  Rennis Chris, MD;  Location: [facility] OR;  Service: Ophthalmology;  Laterality: Right;   GAS/FLUID EXCHANGE Right 12/04/2019   Procedure: GAS/FLUID EXCHANGE;  Surgeon: Rennis Chris, MD;  Location: [facility] OR;  Service: Ophthalmology;  Laterality: Right;   MEMBRANE PEEL Right 12/04/2019   Procedure: MEMBRANE PEEL;  Surgeon: Rennis Chris, MD;  Location[facility] OR;  Service: Ophthalmology;  Laterality: Right;   PHOTOCOAGULATION WITH LASER Right 12/04/2019   Procedure: PHOTOCOAGULATION WITH LASER;  Surgeon: Rennis Chris, MD;  Location: [facility] OR;  Service: Ophthalmology;  Laterality: Right;   TONSILLECTOMY     age 58 yrs old   WISDOM TOOTH  EXTRACTION     Social History   Occupational History   Not on file  Tobacco Use   Smoking status: Never   Smokeless tobacco: Never  Vaping Use   Vaping status: Never Used  Substance and Sexual Activity   Alcohol use: No   Drug use: No   Sexual activity: Not on file

## 2023-01-04 ENCOUNTER — Ambulatory Visit: Payer: Medicare HMO | Admitting: Podiatry

## 2023-01-05 NOTE — Telephone Encounter (Signed)
VOB submitted for Durolane, right knee.

## 2023-01-08 ENCOUNTER — Ambulatory Visit: Payer: Medicare HMO | Admitting: Podiatry

## 2023-01-09 NOTE — Telephone Encounter (Signed)
Filled by vmail request [place]

## 2023-01-25 ENCOUNTER — Other Ambulatory Visit (INDEPENDENT_AMBULATORY_CARE_PROVIDER_SITE_OTHER): Payer: Medicare HMO

## 2023-01-25 ENCOUNTER — Ambulatory Visit: Payer: Medicare HMO | Admitting: Orthopedic Surgery

## 2023-01-25 ENCOUNTER — Encounter: Payer: Self-pay | Admitting: Orthopedic Surgery

## 2023-01-25 VITALS — BP 121/72 | HR 69 | Ht 58.5 in | Wt 112.2 lb

## 2023-01-25 DIAGNOSIS — M5441 Lumbago with sciatica, right side: Secondary | ICD-10-CM

## 2023-01-25 DIAGNOSIS — G8929 Other chronic pain: Secondary | ICD-10-CM

## 2023-01-25 NOTE — Progress Notes (Signed)
Orthopedic Spine Surgery Office Note  Assessment: Patient is a 87 y.o. female with low back pain that radiates into her right buttock and right posterior thigh   Plan: -Patient has tried PT, Tylenol, ibuprofen, lumbar steroid injections -Since pain is currently tolerable, recommended continued use of OTC medications -If pain gets worse, could consider Lyrica or muscle relaxer -Should pain become refractory to conservative treatments, discussed operative management as an option -Patient should return to office on an as needed basis   Patient expressed understanding of the plan and all questions were answered to the patient's satisfaction.   ___________________________________________________________________________   History:  Patient is a 87 y.o. female who presents today for lumbar spine.  Patient has had several years of low back pain that radiates into her right lower extremity.  She said it started about 5 years ago. She feels it has gotten progressively worse with time.  She feels it goes into the right buttock and the right posterior thigh.  She does not feel the pain all the time.  She notes that especially when she is sitting down or getting up from a seated position.  Sometimes she does notice it if she walks for a longer distance.  She has tried over-the-counter medications and steroid injections.  She said she did get good relief with the steroid injection but it only lasted a week.  She feels that Tylenol ibuprofen can control the pain when it is bad.  She does not have any pain radiating into her left lower extremity.  There was no trauma or injury that preceded the onset of pain.   Weakness: Denies Symptoms of imbalance: Denies Paresthesias and numbness: Denies Bowel or bladder incontinence: Has a history of urinary stress incontinence.  No recent changes.  No bowel symptoms Saddle anesthesia: Denies  Treatments tried: PT, Tylenol, ibuprofen, lumbar steroid  injections  Review of systems: Denies fevers and chills, night sweats, unexplained weight loss, history of cancer.  Has had pain that wakes her at night  Past medical history: Glaucoma HTN Macular degeneration  Allergies: sulfa drugs  Past surgical history:  Vitrectomy Cataract surgery Tonsillectomy  Social history: Denies use of nicotine product (smoking, vaping, patches, smokeless) Alcohol use: denies Denies recreational drug use   Physical Exam:  BMI of 23.1  General: no acute distress, appears stated age Neurologic: alert, answering questions appropriately, following commands Respiratory: unlabored breathing on room air, symmetric chest rise Psychiatric: appropriate affect, normal cadence to speech   MSK (spine):  -Strength exam      Left  Right EHL    5/5  5/5 TA    5/5  5/5 GSC    5/5  5/5 Knee extension  5/5  5/5 Hip flexion   5/5  5/5  -Sensory exam    Sensation intact to light touch in L3-S1 nerve distributions of bilateral lower extremities  -Achilles DTR: 1/4 on the left, 1/4 on the right -Patellar tendon DTR: 1/4 on the left, 1/4 on the right  -Straight leg raise: negative bilaterally  -Femoral nerve stretch test: negative bilaterally -Clonus: no beats bilaterally  -Left hip exam: no pain through range of motion, negative faber, negative stinchfield -Right hip exam: no pain through range of motion, negative faber, negative stinchfield  Imaging: XRs of the lumbar spine from 01/25/2023 were independently reviewed and interpreted, showing disc height loss at L4/5 and L5/S1. Grade 1 spondylolisthesis at L4/5. Vacuum disc phenomenon at L4/5. No fracture or dislocation seen. No evidence of instability on flexion/extension  views. Loss of lumbar lordosis. PI of 68, LL of 46.   MRI of the lumbar spine from 10/08/2017 was independently reviewed and interpreted, showing left paracentral disc herniation at L4/5 with cephalad migration. Central and lateral  recess stenosis at L4/5. Central stenosis at L5/S1. Bilateral foraminal stenosis at L4/5 and L5/S1.    Patient name: Kaitlyn Berry Patient MRN: [id number] Date of visit: 01/25/23

## 2023-02-05 ENCOUNTER — Other Ambulatory Visit: Payer: Self-pay

## 2023-02-05 DIAGNOSIS — M1711 Unilateral primary osteoarthritis, right knee: Secondary | ICD-10-CM

## 2023-02-12 ENCOUNTER — Ambulatory Visit: Payer: Medicare HMO | Admitting: Physician Assistant

## 2023-02-12 ENCOUNTER — Encounter: Payer: Self-pay | Admitting: Physician Assistant

## 2023-02-12 DIAGNOSIS — M1711 Unilateral primary osteoarthritis, right knee: Secondary | ICD-10-CM

## 2023-02-12 MED ORDER — SODIUM HYALURONATE 60 MG/3ML IX PRSY
60.0000 mg | PREFILLED_SYRINGE | INTRA_ARTICULAR | Status: AC | PRN
  Administered 2023-02-12: 60 mg via INTRA_ARTICULAR

## 2023-02-12 NOTE — Progress Notes (Signed)
   Procedure Note  Patient: Kaitlyn Berry             Date of Birth: January 03, 1936           MRN: [id number]             Visit Date: 02/12/2023   HPI: [name] 87 year old female comes in today for scheduled right knee Durolane injection.  She has no upcoming surgery on the right knee in the next 6 months.  She has known osteoarthritis of the right knee.  She has tried over-the-counter medications and cortisone injections.  Right knee: No abnormal warmth erythema or effusion.  Good range of motion.  Varus deformity slight.  Patellofemoral crepitus with range of motion. Procedures: Visit Diagnoses:  1. Primary osteoarthritis of right knee     Large Joint Inj: R knee on 02/12/2023 4:00 PM Indications: pain Details: 22 G 1.5 in needle, anterolateral approach  Arthrogram: No  Medications: 60 mg Sodium Hyaluronate 60 MG/3ML Outcome: tolerated well, no immediate complications Procedure, treatment alternatives, risks and benefits explained, specific risks discussed. Consent was given by the patient. Immediately prior to procedure a time out was called to verify the correct patient, procedure, equipment, support staff and site/side marked as required. Patient was prepped and draped in the usual sterile fashion.     Plan: She will follow-up as needed.  She needs to wait 6 months between supplemental injections.

## 2023-03-30 ENCOUNTER — Ambulatory Visit: Payer: Medicare Other | Admitting: Podiatry

## 2023-06-18 ENCOUNTER — Encounter: Payer: Self-pay | Admitting: Podiatry

## 2023-06-18 ENCOUNTER — Ambulatory Visit (INDEPENDENT_AMBULATORY_CARE_PROVIDER_SITE_OTHER): Admitting: Podiatry

## 2023-06-18 DIAGNOSIS — B351 Tinea unguium: Secondary | ICD-10-CM | POA: Diagnosis not present

## 2023-06-18 DIAGNOSIS — M79676 Pain in unspecified toe(s): Secondary | ICD-10-CM | POA: Diagnosis not present

## 2023-06-18 NOTE — Progress Notes (Signed)

## 2023-07-03 ENCOUNTER — Encounter (INDEPENDENT_AMBULATORY_CARE_PROVIDER_SITE_OTHER): Payer: Medicare HMO | Admitting: Ophthalmology

## 2023-07-03 DIAGNOSIS — H35033 Hypertensive retinopathy, bilateral: Secondary | ICD-10-CM

## 2023-07-03 DIAGNOSIS — H401130 Primary open-angle glaucoma, bilateral, stage unspecified: Secondary | ICD-10-CM

## 2023-07-03 DIAGNOSIS — Z961 Presence of intraocular lens: Secondary | ICD-10-CM

## 2023-07-03 DIAGNOSIS — H35341 Macular cyst, hole, or pseudohole, right eye: Secondary | ICD-10-CM

## 2023-07-03 DIAGNOSIS — I10 Essential (primary) hypertension: Secondary | ICD-10-CM

## 2023-08-28 NOTE — Progress Notes (Shared)
 [facility] - Clinic Note  09/04/2023     CHIEF COMPLAINT Patient presents for No chief complaint on file.    HISTORY OF PRESENT ILLNESS: Kaitlyn Berry is a 88 y.o. female who presents to the clinic today for:      Referring physician: Geraldina Klinefelter MD [address] [place], [place] [address]  HISTORICAL INFORMATION:   Selected notes from the MEDICAL RECORD NUMBER Referred by Dr. Allison Ivory for macular hole LEE: 11/17/2019 BCVA OD: 20/200 OS: 20/50 Ocular Hx- possible macular hole, pseudophakia OU PMH- HTN   CURRENT MEDICATIONS: Current Outpatient Medications (Ophthalmic Drugs)  Medication Sig   atropine  1 % ophthalmic solution Place 1 drop into the right eye in the morning and at bedtime.   bacitracin -polymyxin b  (POLYSPORIN ) ophthalmic ointment Place 1 application into the right eye at bedtime. And as needed   gatifloxacin  (ZYMAXID ) 0.5 % SOLN Place 1 drop into the right eye.   timolol  (TIMOPTIC ) 0.5 % ophthalmic solution Place 1 drop into both eyes daily.   No current facility-administered medications for this visit. (Ophthalmic Drugs)   Current Outpatient Medications (Other)  Medication Sig   acetaminophen (TYLENOL) 650 MG CR tablet Take 1,300 mg by mouth every 8 (eight) hours as needed for pain.   ALPRAZolam (XANAX) 0.25 MG tablet Take 0.25 mg by mouth at bedtime as needed for anxiety.   amLODipine (NORVASC) 10 MG tablet    amLODipine (NORVASC) 5 MG tablet Take 5 mg by mouth 2 (two) times daily.   aspirin EC 81 MG tablet Take 81 mg by mouth daily. Swallow whole.   buPROPion (WELLBUTRIN XL) 150 MG 24 hr tablet    Calcium Citrate-Vitamin D (CITRACAL + D PO) Take 600 mg by mouth daily.   celecoxib (CELEBREX) 100 MG capsule Take 100 mg by mouth 2 (two) times daily.   ketoconazole  (NIZORAL ) 2 % cream Apply 1 Application topically daily.   lamoTRIgine (LAMICTAL) 100 MG tablet    metoprolol succinate (TOPROL-XL) 100 MG 24 hr tablet Take 100 mg  by mouth daily.   Multiple Vitamins-Minerals (PRESERVISION/LUTEIN) CAPS Take 1 capsule by mouth daily.   olmesartan-hydrochlorothiazide (BENICAR HCT) 40-25 MG tablet Take 1 tablet by mouth daily.    oxybutynin (DITROPAN) 5 MG tablet    potassium chloride (MICRO-K) 10 MEQ CR capsule Take 10 mEq by mouth daily.   traZODone (DESYREL) 50 MG tablet    No current facility-administered medications for this visit. (Other)   REVIEW OF SYSTEMS:     ALLERGIES Allergies  Allergen Reactions   Sulfa Antibiotics Rash   PAST MEDICAL HISTORY Past Medical History:  Diagnosis Date   Glaucoma    OU   Hypertension    Hypertensive retinopathy    OU   Macular degeneration    OU   Osteoarthritis of both knees    Seasonal allergies    Past Surgical History:  Procedure Laterality Date   25 GAUGE PARS PLANA VITRECTOMY WITH 20 GAUGE MVR PORT FOR MACULAR HOLE Right 12/04/2019   Procedure: 25 GAUGE PARS PLANA VITRECTOMY WITH 20 GAUGE MVR PORT FOR MACULAR HOLE;  Surgeon: Ronelle Coffee, MD;  Location: [facility] OR;  Service: Ophthalmology;  Laterality: Right;   CATARACT EXTRACTION Bilateral    COLONOSCOPY     EYE SURGERY Bilateral    Cat Sx   EYE SURGERY Right 12/04/2019   [facility] - Dr. Ronelle Coffee   GAS INSERTION Right 12/04/2019   Procedure: INSERTION OF GAS -  C3F8;  Surgeon: Ronelle Coffee, MD;  Location: [facility] OR;  Service: Ophthalmology;  Laterality: Right;   GAS/FLUID EXCHANGE Right 12/04/2019   Procedure: GAS/FLUID EXCHANGE;  Surgeon: Ronelle Coffee, MD;  Location: [facility] OR;  Service: Ophthalmology;  Laterality: Right;   MEMBRANE PEEL Right 12/04/2019   Procedure: MEMBRANE PEEL;  Surgeon: Ronelle Coffee, MD;  Location: [facility] OR;  Service: Ophthalmology;  Laterality: Right;   PHOTOCOAGULATION WITH LASER Right 12/04/2019   Procedure: PHOTOCOAGULATION WITH LASER;  Surgeon: Ronelle Coffee, MD;  Location: [facility] OR;  Service: Ophthalmology;  Laterality: Right;   TONSILLECTOMY     age 78 yrs old   WISDOM TOOTH  EXTRACTION     FAMILY HISTORY No family history on file.  SOCIAL HISTORY Social History   Tobacco Use   Smoking status: Never   Smokeless tobacco: Never  Vaping Use   Vaping status: Never Used  Substance Use Topics   Alcohol use: No   Drug use: No       OPHTHALMIC EXAM:  Not recorded     IMAGING AND PROCEDURES  Imaging and Procedures for 09/04/2023          ASSESSMENT/PLAN:  No diagnosis found.   1. Macular hole, right eye   - s/p PPV/TissueBlue /MP/14% C3F8 OD, 09.16.21             - doing well  - BCVA stable at 20/30              - mac hole closed, retina attached  - OCT shows interval improvement in central ellipsoid signal             - IOP good at 11 - f/u 9 months. DFE, OCT  2,3. Hypertensive retinopathy OU - discussed importance of tight BP control - monitor  4. Pseudophakia OU  - s/p CE/IOL OU ([facility])  - IOLs in good position, doing well  - monitor  5. POAG  - under the expert management of Dr. Allison Ivory  - IOP today: 11,10  - cont Timoptic  qam OU and Latanoprost qhs OU   Ophthalmic Meds Ordered this visit:  No orders of the defined types were placed in this encounter.    No follow-ups on file.  There are no Patient Instructions on file for this visit.  This document serves as a record of services personally performed by Jeanice Millard, MD, PhD. It was created on their behalf by Angelia Kelp, an ophthalmic technician. The creation of this record is the provider's dictation and/or activities during the visit.    Electronically signed by: Angelia Kelp, OA, 08/28/23  12:17 PM    Jeanice Millard, M.D., Ph.D. Diseases & Surgery of the Retina and Vitreous [facility]    Abbreviations: M myopia (nearsighted); A astigmatism; H hyperopia (farsighted); P presbyopia; Mrx spectacle prescription;  CTL contact lenses; OD right eye; OS left eye; OU both eyes  XT exotropia; ET esotropia; PEK punctate  epithelial keratitis; PEE punctate epithelial erosions; DES dry eye syndrome; MGD meibomian gland dysfunction; ATs artificial tears; PFAT's preservative free artificial tears; NSC nuclear sclerotic cataract; PSC posterior subcapsular cataract; ERM epi-retinal membrane; PVD posterior vitreous detachment; RD retinal detachment; DM diabetes mellitus; DR diabetic retinopathy; NPDR non-proliferative diabetic retinopathy; PDR proliferative diabetic retinopathy; CSME clinically significant macular edema; DME diabetic macular edema; dbh dot blot hemorrhages; CWS cotton wool spot; POAG primary open angle glaucoma; C/D cup-to-disc ratio; HVF humphrey visual field; GVF goldmann visual field; OCT optical coherence tomography;  IOP intraocular pressure; BRVO Branch retinal vein occlusion; CRVO central retinal vein occlusion; CRAO central retinal artery occlusion; BRAO branch retinal artery occlusion; RT retinal tear; SB scleral buckle; PPV pars plana vitrectomy; VH Vitreous hemorrhage; PRP panretinal laser photocoagulation; IVK intravitreal kenalog ; VMT vitreomacular traction; MH Macular hole;  NVD neovascularization of the disc; NVE neovascularization elsewhere; AREDS age related eye disease study; ARMD age related macular degeneration; POAG primary open angle glaucoma; EBMD epithelial/anterior basement membrane dystrophy; ACIOL anterior chamber intraocular lens; IOL intraocular lens; PCIOL posterior chamber intraocular lens; Phaco/IOL phacoemulsification with intraocular lens placement; PRK photorefractive keratectomy; LASIK laser assisted in situ keratomileusis; HTN hypertension; DM diabetes mellitus; COPD chronic obstructive pulmonary disease

## 2023-09-04 ENCOUNTER — Encounter (INDEPENDENT_AMBULATORY_CARE_PROVIDER_SITE_OTHER): Payer: Self-pay | Admitting: Ophthalmology

## 2023-09-04 DIAGNOSIS — H401130 Primary open-angle glaucoma, bilateral, stage unspecified: Secondary | ICD-10-CM

## 2023-09-04 DIAGNOSIS — H35033 Hypertensive retinopathy, bilateral: Secondary | ICD-10-CM

## 2023-09-04 DIAGNOSIS — I10 Essential (primary) hypertension: Secondary | ICD-10-CM

## 2023-09-04 DIAGNOSIS — Z961 Presence of intraocular lens: Secondary | ICD-10-CM

## 2023-09-04 DIAGNOSIS — H35341 Macular cyst, hole, or pseudohole, right eye: Secondary | ICD-10-CM

## 2023-09-18 ENCOUNTER — Ambulatory Visit: Admitting: Podiatry

## 2023-10-25 ENCOUNTER — Ambulatory Visit: Admitting: Podiatry

## 2023-10-25 ENCOUNTER — Encounter: Payer: Self-pay | Admitting: Podiatry

## 2023-10-25 DIAGNOSIS — M79676 Pain in unspecified toe(s): Secondary | ICD-10-CM | POA: Diagnosis not present

## 2023-10-25 DIAGNOSIS — B351 Tinea unguium: Secondary | ICD-10-CM

## 2023-10-25 NOTE — Progress Notes (Signed)

## 2023-10-30 ENCOUNTER — Encounter (INDEPENDENT_AMBULATORY_CARE_PROVIDER_SITE_OTHER): Payer: Self-pay | Admitting: Ophthalmology

## 2023-10-30 ENCOUNTER — Ambulatory Visit (INDEPENDENT_AMBULATORY_CARE_PROVIDER_SITE_OTHER): Admitting: Ophthalmology

## 2023-10-30 DIAGNOSIS — H35033 Hypertensive retinopathy, bilateral: Secondary | ICD-10-CM

## 2023-10-30 DIAGNOSIS — H35341 Macular cyst, hole, or pseudohole, right eye: Secondary | ICD-10-CM | POA: Diagnosis not present

## 2023-10-30 DIAGNOSIS — I10 Essential (primary) hypertension: Secondary | ICD-10-CM | POA: Diagnosis not present

## 2023-10-30 DIAGNOSIS — Z961 Presence of intraocular lens: Secondary | ICD-10-CM | POA: Diagnosis not present

## 2023-10-30 NOTE — Progress Notes (Signed)
 [facility] - Clinic Note  10/30/2023     CHIEF COMPLAINT Patient presents for Retina Follow Up    HISTORY OF PRESENT ILLNESS: Kaitlyn Berry is a 88 y.o. female who presents to the clinic today for:   HPI     Retina Follow Up   Patient presents with  Other.  In right eye.  This started 9 months ago.  I, the attending physician,  performed the HPI with the patient and updated documentation appropriately.        Comments   Patient here for 9 months retina follow up for mac hole OD. Patient states vision today noticed was off. There were 4 numbers saw 3. Can read magazines and books and see tv without glasses. OS bothersome. Sometimes OS has eye pain-not heavy. Like sticky. Uses timolol  QAM OU.      Last edited by Valdemar Rogue, MD on 11/04/2023  1:05 AM.     Pt states [place] is so so, she can read, drive and see TV w/o her glasses.   Referring physician: Marcey Standing MD [address] [place], [place] [address]  HISTORICAL INFORMATION:   Selected notes from the MEDICAL RECORD NUMBER Referred by Dr. Marcey for macular hole LEE: 11/17/2019 BCVA OD: 20/200 OS: 20/50 Ocular Hx- possible macular hole, pseudophakia OU PMH- HTN   CURRENT MEDICATIONS: Current Outpatient Medications (Ophthalmic Drugs)  Medication Sig   atropine  1 % ophthalmic solution Place 1 drop into the right eye in the morning and at bedtime.   bacitracin -polymyxin b  (POLYSPORIN ) ophthalmic ointment Place 1 application into the right eye at bedtime. And as needed   gatifloxacin  (ZYMAXID ) 0.5 % SOLN Place 1 drop into the right eye.   timolol  (TIMOPTIC ) 0.5 % ophthalmic solution Place 1 drop into both eyes daily.   No current facility-administered medications for this visit. (Ophthalmic Drugs)   Current Outpatient Medications (Other)  Medication Sig   acetaminophen (TYLENOL) 650 MG CR tablet Take 1,300 mg by mouth every 8 (eight) hours as needed for pain.   ALPRAZolam (XANAX)  0.25 MG tablet Take 0.25 mg by mouth at bedtime as needed for anxiety.   amLODipine (NORVASC) 10 MG tablet    amLODipine (NORVASC) 5 MG tablet Take 5 mg by mouth 2 (two) times daily.   aspirin EC 81 MG tablet Take 81 mg by mouth daily. Swallow whole.   buPROPion (WELLBUTRIN XL) 150 MG 24 hr tablet    Calcium Citrate-Vitamin D (CITRACAL + D PO) Take 600 mg by mouth daily.   celecoxib (CELEBREX) 100 MG capsule Take 100 mg by mouth 2 (two) times daily.   ketoconazole  (NIZORAL ) 2 % cream Apply 1 Application topically daily.   lamoTRIgine (LAMICTAL) 100 MG tablet    metoprolol succinate (TOPROL-XL) 100 MG 24 hr tablet Take 100 mg by mouth daily.   Multiple Vitamins-Minerals (PRESERVISION/LUTEIN) CAPS Take 1 capsule by mouth daily.   olmesartan-hydrochlorothiazide (BENICAR HCT) 40-25 MG tablet Take 1 tablet by mouth daily.    oxybutynin (DITROPAN) 5 MG tablet    potassium chloride (MICRO-K) 10 MEQ CR capsule Take 10 mEq by mouth daily.   traZODone (DESYREL) 50 MG tablet    No current facility-administered medications for this visit. (Other)   REVIEW OF SYSTEMS: ROS   Positive for: Musculoskeletal, Cardiovascular, Eyes Negative for: Constitutional, Gastrointestinal, Neurological, Skin, Genitourinary, HENT, Endocrine, Respiratory, Psychiatric, Allergic/Imm, Heme/Lymph Last edited by Orval Asberry RAMAN, COA on 10/30/2023  1:31 PM.  ALLERGIES Allergies  Allergen Reactions   Sulfa Antibiotics Rash   PAST MEDICAL HISTORY Past Medical History:  Diagnosis Date   Glaucoma    OU   Hypertension    Hypertensive retinopathy    OU   Macular degeneration    OU   Osteoarthritis of both knees    Seasonal allergies    Past Surgical History:  Procedure Laterality Date   25 GAUGE PARS PLANA VITRECTOMY WITH 20 GAUGE MVR PORT FOR MACULAR HOLE Right 12/04/2019   Procedure: 25 GAUGE PARS PLANA VITRECTOMY WITH 20 GAUGE MVR PORT FOR MACULAR HOLE;  Surgeon: Valdemar Rogue, MD;  Location: [facility] OR;  Service:  Ophthalmology;  Laterality: Right;   CATARACT EXTRACTION Bilateral    COLONOSCOPY     EYE SURGERY Bilateral    Cat Sx   EYE SURGERY Right 12/04/2019   [facility] Repair - Dr. Rogue Valdemar   GAS INSERTION Right 12/04/2019   Procedure: INSERTION OF GAS - C3F8;  Surgeon: Valdemar Rogue, MD;  Location: [facility] OR;  Service: Ophthalmology;  Laterality: Right;   GAS/FLUID EXCHANGE Right 12/04/2019   Procedure: GAS/FLUID EXCHANGE;  Surgeon: Valdemar Rogue, MD;  Location: [facility] OR;  Service: Ophthalmology;  Laterality: Right;   MEMBRANE PEEL Right 12/04/2019   Procedure: MEMBRANE PEEL;  Surgeon: Valdemar Rogue, MD;  Location: [facility] OR;  Service: Ophthalmology;  Laterality: Right;   PHOTOCOAGULATION WITH LASER Right 12/04/2019   Procedure: PHOTOCOAGULATION WITH LASER;  Surgeon: Valdemar Rogue, MD;  Location: [facility] OR;  Service: Ophthalmology;  Laterality: Right;   TONSILLECTOMY     age 3 yrs old   WISDOM TOOTH EXTRACTION     FAMILY HISTORY History reviewed. No pertinent family history.  SOCIAL HISTORY Social History   Tobacco Use   Smoking status: Never   Smokeless tobacco: Never  Vaping Use   Vaping status: Never Used  Substance Use Topics   Alcohol use: No   Drug use: No       OPHTHALMIC EXAM:  Base Eye Exam     Visual Acuity (Snellen - Linear)       Right Left   Dist [place] 20/40 -2 20/60 -2   Dist ph [place] 20/40 +2          Tonometry (Tonopen, 1:27 PM)       Right Left   Pressure 12 13         Pupils       Dark Light Shape React APD   Right 2 1.5 Round Brisk None   Left 2 1.5 Round Brisk None         Visual Fields (Counting fingers)       Left Right    Full Full         Extraocular Movement       Right Left    Full, Ortho Full, Ortho         Neuro/Psych     Oriented x3: Yes   Mood/Affect: Normal         Dilation     Both eyes: 1.0% Mydriacyl , 2.5% Phenylephrine  @ 1:27 PM           Slit Lamp and Fundus Exam     Slit Lamp Exam       Right Left    Lids/Lashes Dermatochalasis - upper lid, Meibomian gland dysfunction Dermatochalasis, Meibomian gland dysfunction   Conjunctiva/Sclera Mild Melanosis White and quiet, nasal and temporal pinguecula   Cornea arcus, tear film debris, trace PEE, well healed cataract  wound arcus, tear film debris, well healed cataract wound, trace PEE   Anterior Chamber deep and clear deep and clear   Iris Round and moderately dilated to 6.66mm Round and moderately dilated to 6.4mm   Lens 3 piece PCIOL, mild temporal displacement, Open posterior capsule 3 piece PC IOL in good position with open PC   Anterior Vitreous post vitrectomy; clear mild syneresis         Fundus Exam       Right Left   Disc 1-2+Pallor, Sharp rim, +cupping, +PPP Very thin inferior rim, 1-2+pallor, temporal PPA, focal PPP, +cupping   C/D Ratio 0.8 0.85   Macula Flat, mac hole closed, RPE mottling, No heme or edema Flat, Blunted foveal reflex, RPE mottling, clumping and early atrophy centrally, drusen, mild ERM   Vessels attenuated, Tortuous attenuated, Tortuous   Periphery Attached, mild peripheral drusen, peripheral laser scars, No heme Attached, midzonal and peripheral drusen, no heme           Refraction     Wearing Rx       Sphere Cylinder Axis Add   Right -1.50 +1.00 175 +2.50   Left -1.25 +1.25 005 +2.50    Type: PAL            IMAGING AND PROCEDURES  Imaging and Procedures for 10/30/2023  OCT, Retina - OU - Both Eyes       Right Eye Quality was good. Central Foveal Thickness: 223. Progression has improved. Findings include normal foveal contour, no IRF, no SRF, outer retinal atrophy (Mac hole closed, interval improvement in central ellipsoid signal).   Left Eye Quality was good. Central Foveal Thickness: 187. Progression has been stable. Findings include normal foveal contour, no IRF, no SRF, subretinal hyper-reflective material, outer retinal atrophy (Focal central ellipsoid disruption with focal SRHM within  and ORA -- stable from prior, mild ERM inferiorly -- stable from prior).   Notes *Images captured and stored on drive  Diagnosis / Impression:  OD: Mac hole closed, interval improvement in central ellipsoid signal OS: Focal central ellipsoid disruption and ORA -- stable from prior  Clinical management:  See below  Abbreviations: NFP - Normal foveal profile. CME - cystoid macular edema. PED - pigment epithelial detachment. IRF - intraretinal fluid. SRF - subretinal fluid. EZ - ellipsoid zone. ERM - epiretinal membrane. ORA - outer retinal atrophy. ORT - outer retinal tubulation. SRHM - subretinal hyper-reflective material. IRHM - intraretinal hyper-reflective material            ASSESSMENT/PLAN:    ICD-10-CM   1. Macular hole of right eye  H35.341 OCT, Retina - OU - Both Eyes    2. Essential hypertension  I10     3. Hypertensive retinopathy of both eyes  H35.033     4. Pseudophakia of both eyes  Z96.1      1. Macular hole, right eye   - s/p PPV/TissueBlue/MP/14% C3F8 OD, 09.16.21             - doing well  - BCVA 20/40             - mac hole closed, retina attached  - OCT shows interval improvement in central ellipsoid signal             - IOP good at 12 - f/u 1 yr, sooner prn - DFE, OCT  2,3. Hypertensive retinopathy OU - discussed importance of tight BP control - monitor  4. Pseudophakia OU  - s/p CE/IOL OU [facility])  - IOLs in good position, doing well  - monitor  5. POAG  - under the expert management of Dr. Marcey  - IOP today: 12,13  - cont Timoptic qam OU and Latanoprost qhs OU   Ophthalmic Meds Ordered this visit:  No orders of the defined types were placed in this encounter.    Return in about 1 year (around 10/29/2024) for mac hole OD, DFE, OCT.  There are no Patient Instructions on file for this visit.  This document serves as a record of services personally performed by Redell JUDITHANN Hans, MD, PhD. It was created on their behalf by  Almetta Pesa, an ophthalmic technician. The creation of this record is the provider's dictation and/or activities during the visit.    Electronically signed by: Almetta Pesa, OA, 11/04/23  1:06 AM  Redell JUDITHANN Hans, M.D., Ph.D. Diseases & Surgery of the Retina and Vitreous [facility]  I have reviewed the above documentation for accuracy and completeness, and I agree with the above. Redell JUDITHANN Hans, M.D., Ph.D. 11/04/23 1:07 AM    Abbreviations: M myopia (nearsighted); A astigmatism; H hyperopia (farsighted); P presbyopia; Mrx spectacle prescription;  CTL contact lenses; OD right eye; OS left eye; OU both eyes  XT exotropia; ET esotropia; PEK punctate epithelial keratitis; PEE punctate epithelial erosions; DES dry eye syndrome; MGD meibomian gland dysfunction; ATs artificial tears; PFAT's preservative free artificial tears; NSC nuclear sclerotic cataract; PSC posterior subcapsular cataract; ERM epi-retinal membrane; PVD posterior vitreous detachment; RD retinal detachment; DM diabetes mellitus; DR diabetic retinopathy; NPDR non-proliferative diabetic retinopathy; PDR proliferative diabetic retinopathy; CSME clinically significant macular edema; DME diabetic macular edema; dbh dot blot hemorrhages; CWS cotton wool spot; POAG primary open angle glaucoma; C/D cup-to-disc ratio; HVF humphrey visual field; GVF goldmann visual field; OCT optical coherence tomography; IOP intraocular pressure; BRVO Branch retinal vein occlusion; CRVO central retinal vein occlusion; CRAO central retinal artery occlusion; BRAO branch retinal artery occlusion; RT retinal tear; SB scleral buckle; PPV pars plana vitrectomy; VH Vitreous hemorrhage; PRP panretinal laser photocoagulation; IVK intravitreal kenalog; VMT vitreomacular traction; MH Macular hole;  NVD neovascularization of the disc; NVE neovascularization elsewhere; AREDS age related eye disease study; ARMD age related macular degeneration;  POAG primary open angle glaucoma; EBMD epithelial/anterior basement membrane dystrophy; ACIOL anterior chamber intraocular lens; IOL intraocular lens; PCIOL posterior chamber intraocular lens; Phaco/IOL phacoemulsification with intraocular lens placement; PRK photorefractive keratectomy; LASIK laser assisted in situ keratomileusis; HTN hypertension; DM diabetes mellitus; COPD chronic obstructive pulmonary disease

## 2023-11-04 ENCOUNTER — Encounter (INDEPENDENT_AMBULATORY_CARE_PROVIDER_SITE_OTHER): Payer: Self-pay | Admitting: Ophthalmology

## 2024-01-24 ENCOUNTER — Ambulatory Visit: Admitting: Podiatry

## 2024-01-24 DIAGNOSIS — B351 Tinea unguium: Secondary | ICD-10-CM

## 2024-01-24 DIAGNOSIS — M79676 Pain in unspecified toe(s): Secondary | ICD-10-CM

## 2024-01-24 NOTE — Progress Notes (Addendum)
 This patient presents to the office with chief complaint of long thick painful nails.  Patient says the nails are painful walking and wearing shoes.  This patient is unable to self treat.  This patient is unable to trim her nails since she is unable to reach her nails.  She presents to the office for preventative foot care services.  General Appearance  Alert, conversant and in no acute stress.  Vascular  Dorsalis pedis and posterior tibial  pulses are palpable  bilaterally.  Capillary return is within normal limits  bilaterally. Temperature is within normal limits  bilaterally.  Neurologic  Senn-Weinstein monofilament wire test within normal limits  bilaterally. Muscle power within normal limits bilaterally.  Nails Thick disfigured discolored nails with subungual debris  from hallux to fifth toes bilaterally. No evidence of bacterial infection or drainage bilaterally.  Orthopedic  No limitations of motion  feet .  No crepitus or effusions noted.  No bony pathology or digital deformities noted.  Hav B/L  Skin  normotropic skin with no porokeratosis noted bilaterally.  No signs of infections or ulcers noted.     Onychomycosis  Nails  B/L.  Pain in right toes  Pain in left toes  Debridement of nails both feet followed trimming the nails with dremel tool.    RTC 10 weeks    Cordella Bold DPM

## 2024-04-01 ENCOUNTER — Encounter: Payer: Self-pay | Admitting: Podiatry

## 2024-04-01 ENCOUNTER — Ambulatory Visit: Admitting: Podiatry

## 2024-04-01 DIAGNOSIS — M79676 Pain in unspecified toe(s): Secondary | ICD-10-CM | POA: Diagnosis not present

## 2024-04-01 DIAGNOSIS — B351 Tinea unguium: Secondary | ICD-10-CM

## 2024-04-01 NOTE — Progress Notes (Signed)
 This patient presents to the office with chief complaint of long thick painful nails.  Patient says the nails are painful walking and wearing shoes.  This patient is unable to self treat.  This patient is unable to trim her nails since she is unable to reach her nails.  She presents to the office for preventative foot care services.  General Appearance  Alert, conversant and in no acute stress.  Vascular  Dorsalis pedis and posterior tibial  pulses are palpable  bilaterally.  Capillary return is within normal limits  bilaterally. Temperature is within normal limits  bilaterally.  Neurologic  Senn-Weinstein monofilament wire test within normal limits  bilaterally. Muscle power within normal limits bilaterally.  Nails Thick disfigured discolored nails with subungual debris  from hallux to fifth toes bilaterally. No evidence of bacterial infection or drainage bilaterally.  Orthopedic  No limitations of motion  feet .  No crepitus or effusions noted.  No bony pathology or digital deformities noted.  Hav B/L  Skin  normotropic skin with no porokeratosis noted bilaterally.  No signs of infections or ulcers noted.     Onychomycosis  Nails  B/L.  Pain in right toes  Pain in left toes  Debridement of nails both feet followed trimming the nails with dremel tool.    RTC 10 weeks    Cordella Bold DPM

## 2024-04-03 ENCOUNTER — Ambulatory Visit: Admitting: Podiatry

## 2024-04-17 ENCOUNTER — Other Ambulatory Visit (INDEPENDENT_AMBULATORY_CARE_PROVIDER_SITE_OTHER): Payer: Self-pay

## 2024-04-17 ENCOUNTER — Ambulatory Visit: Admitting: Physician Assistant

## 2024-04-17 DIAGNOSIS — G8929 Other chronic pain: Secondary | ICD-10-CM

## 2024-04-17 DIAGNOSIS — M25561 Pain in right knee: Secondary | ICD-10-CM | POA: Diagnosis not present

## 2024-04-17 NOTE — Progress Notes (Signed)
 HPI: [name] comes in today for right knee pain.  She was last seen on 02/12/2023 and was given a right knee supplemental injection.  She states that knee pain did not return until this January.  She has had no new injury.  She does have some pain in the medial aspect of the knee.  She uses a cane to ambulate as the knee will give way at times.  She wears a pullover knee sleeve.  She is asking about getting another viscosupplementation injection.  Review of systems: See HPI otherwise negative  Physical exam: General Well-developed well-nourished female ambulates with a cane slight antalgic gait. Psych: Alert and orient x 3 Right knee: Obvious varus deformity.  No abnormal warmth erythema or effusion.  Tenderness along medial joint line.  Patellofemoral crepitus with range of motion overall good range of motion of both knees.  Radiographs:Right knee 2 views: No interval change.  Varus deformity with bone-on-bone medial compartment.  Mild to moderate patellofemoral changes.  Knee is well located.  No acute fractures.  Impression: Right knee arthritis  Plan: Will try to gain approval for repeat viscosupplementation injection in of her right knee.  She has failed conservative treatment which is included medications exercise assistive devices and cortisone injections.

## 2024-04-18 ENCOUNTER — Other Ambulatory Visit: Payer: Self-pay | Admitting: Radiology

## 2024-04-18 DIAGNOSIS — M1711 Unilateral primary osteoarthritis, right knee: Secondary | ICD-10-CM

## 2024-10-28 ENCOUNTER — Encounter (INDEPENDENT_AMBULATORY_CARE_PROVIDER_SITE_OTHER): Admitting: Ophthalmology
